# Patient Record
Sex: Male | Born: 1990
Health system: Southern US, Community
[De-identification: ages and names within clinical notes are randomized; demographics above are authoritative.]

## PROBLEM LIST (undated history)

## (undated) DIAGNOSIS — F259 Schizoaffective disorder, unspecified: Secondary | ICD-10-CM

## (undated) DIAGNOSIS — I10 Essential (primary) hypertension: Secondary | ICD-10-CM

## (undated) DIAGNOSIS — G4700 Insomnia, unspecified: Secondary | ICD-10-CM

## (undated) HISTORY — DX: Insomnia, unspecified: G47.00

## (undated) HISTORY — DX: Essential (primary) hypertension: I10

---
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
## 2009-11-26 ENCOUNTER — Emergency Department (HOSPITAL_COMMUNITY): Admission: EM | Admit: 2009-11-26 | Discharge: 2009-11-27 | Payer: Self-pay | Admitting: Emergency Medicine

## 2014-02-05 DIAGNOSIS — F259 Schizoaffective disorder, unspecified: Secondary | ICD-10-CM

## 2014-02-05 HISTORY — DX: Schizoaffective disorder, unspecified: F25.9

## 2014-11-11 ENCOUNTER — Other Ambulatory Visit (HOSPITAL_COMMUNITY): Payer: Self-pay | Admitting: Family Medicine

## 2014-11-11 DIAGNOSIS — F411 Generalized anxiety disorder: Secondary | ICD-10-CM

## 2014-11-11 DIAGNOSIS — R519 Headache, unspecified: Secondary | ICD-10-CM

## 2014-11-11 DIAGNOSIS — R51 Headache: Principal | ICD-10-CM

## 2014-11-11 DIAGNOSIS — F410 Panic disorder [episodic paroxysmal anxiety] without agoraphobia: Secondary | ICD-10-CM

## 2014-11-12 ENCOUNTER — Ambulatory Visit (HOSPITAL_COMMUNITY): Payer: BLUE CROSS/BLUE SHIELD

## 2014-11-19 ENCOUNTER — Ambulatory Visit (HOSPITAL_COMMUNITY)
Admission: RE | Admit: 2014-11-19 | Discharge: 2014-11-19 | Disposition: A | Discharge status: Home / Self Care | Payer: BLUE CROSS/BLUE SHIELD | Source: Ambulatory Visit | Attending: Family Medicine | Admitting: Family Medicine

## 2014-11-19 DIAGNOSIS — F411 Generalized anxiety disorder: Secondary | ICD-10-CM

## 2014-11-19 DIAGNOSIS — R51 Headache: Secondary | ICD-10-CM | POA: Diagnosis present

## 2014-11-19 DIAGNOSIS — R519 Headache, unspecified: Secondary | ICD-10-CM

## 2014-11-19 DIAGNOSIS — F419 Anxiety disorder, unspecified: Secondary | ICD-10-CM | POA: Diagnosis not present

## 2016-09-03 IMAGING — CT CT HEAD W/O CM
1 series · 16 of 30 positions shown, 20 images · non-contrast
Comparison: None.

CLINICAL DATA: Intermittent strange feeling inside of head. Patient
on anti-anxiety medication.

EXAM:
CT HEAD WITHOUT CONTRAST
TECHNIQUE: Contiguous axial images were obtained from the base of the skull
through the vertex without intravenous contrast.

[Series 2: head 5.0 h30s · axial · 0.50mm/px · z∈[-79,+66]mm · 16 of 33 slices shown, 20 images]
[im 2/33  brain]
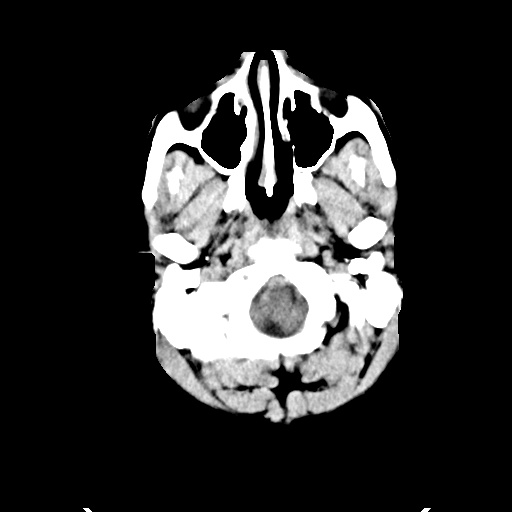
[im 2/33  bone]
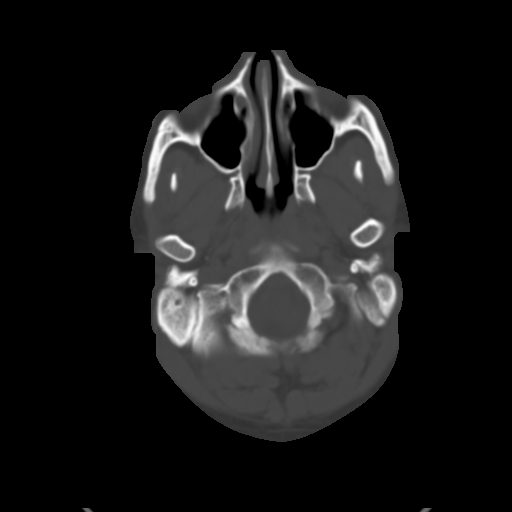
[im 4/33  brain]
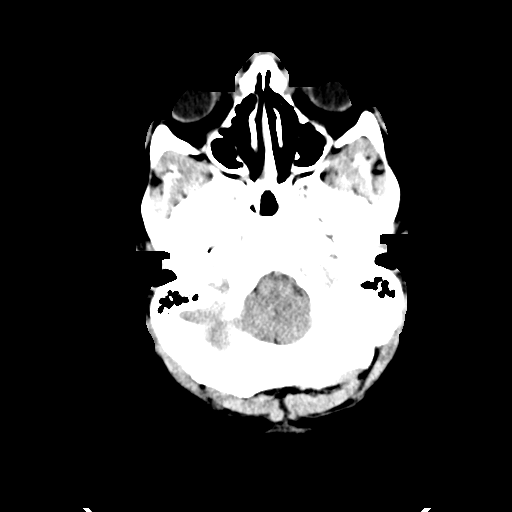
[im 6/33  brain]
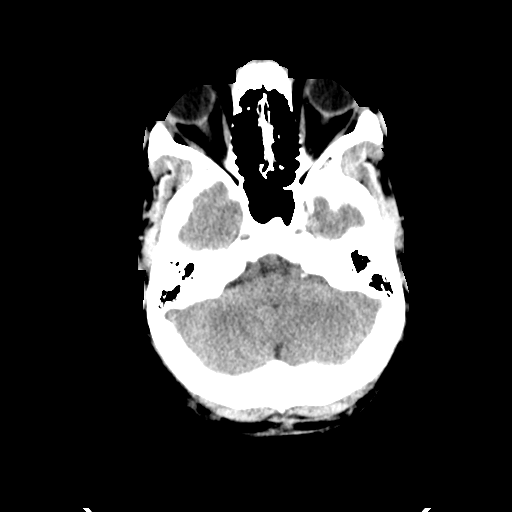
[im 8/33  brain]
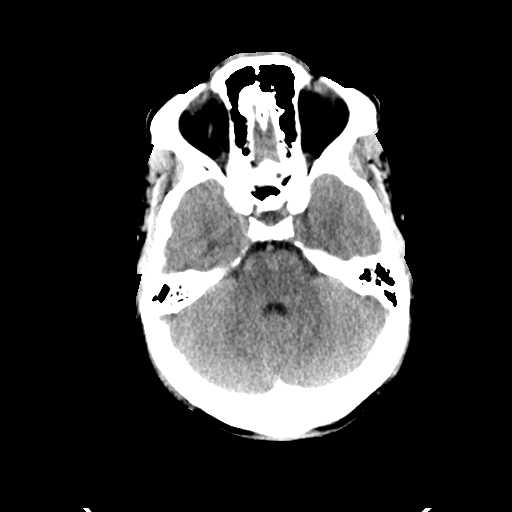
[im 9/33  brain]
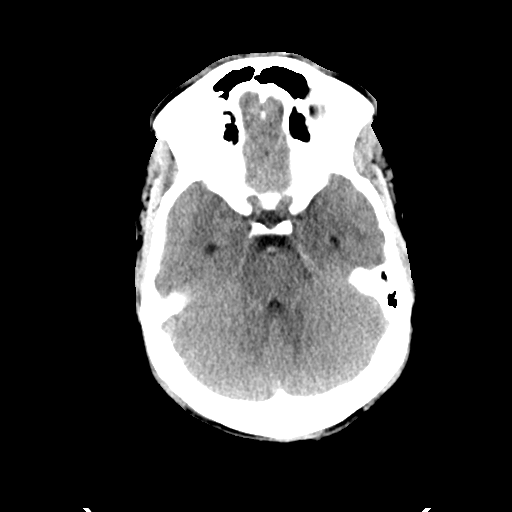
[im 9/33  bone]
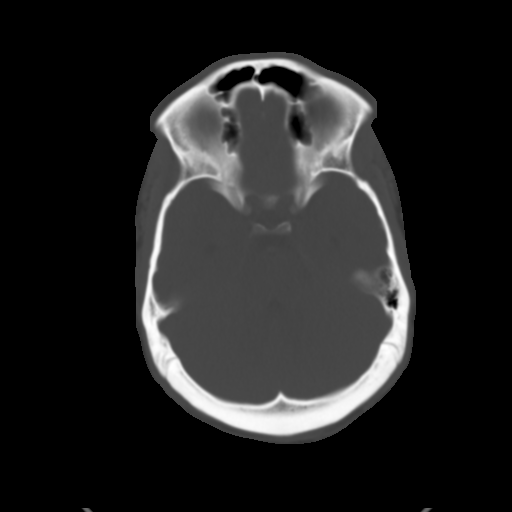
[im 12/33  brain]
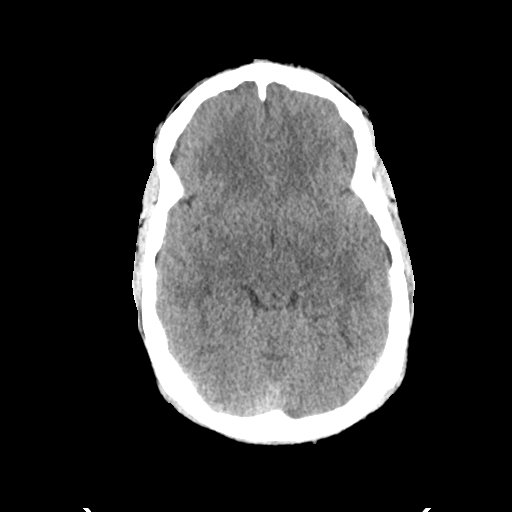
[im 14/33  brain]
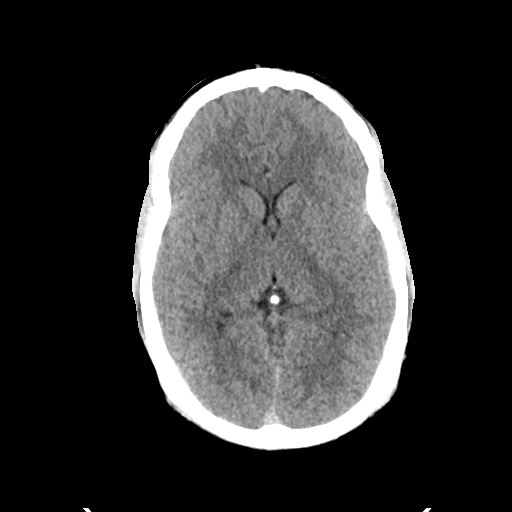
[im 16/33  brain]
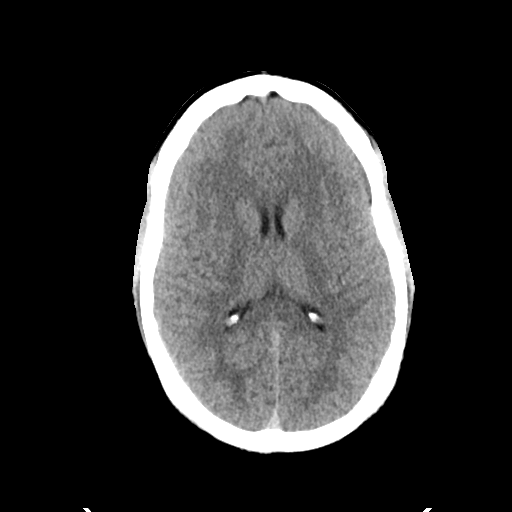
[im 17/33  brain]
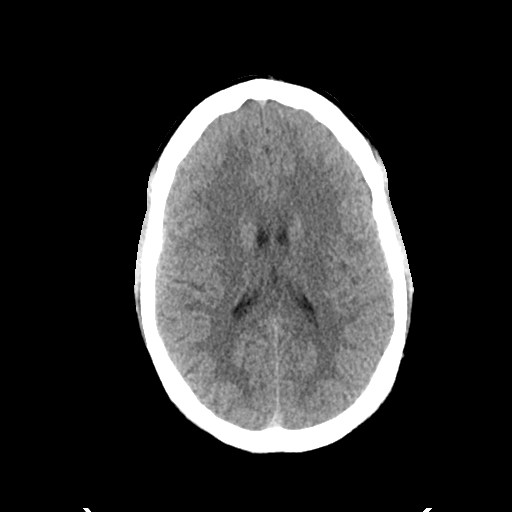
[im 17/33  bone]
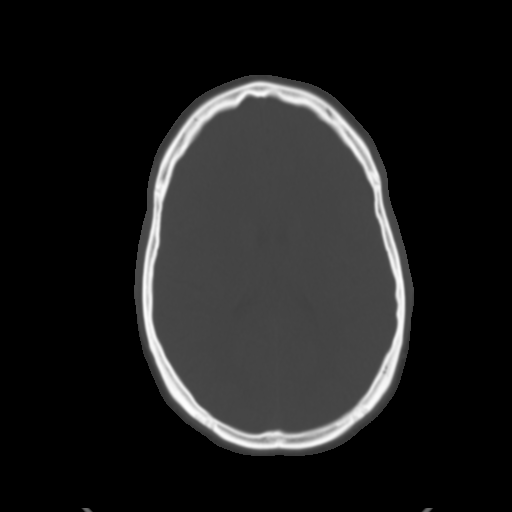
[im 19/33  brain]
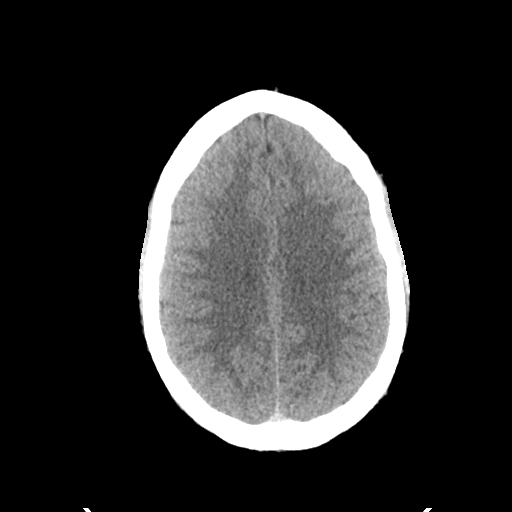
[im 21/33  brain]
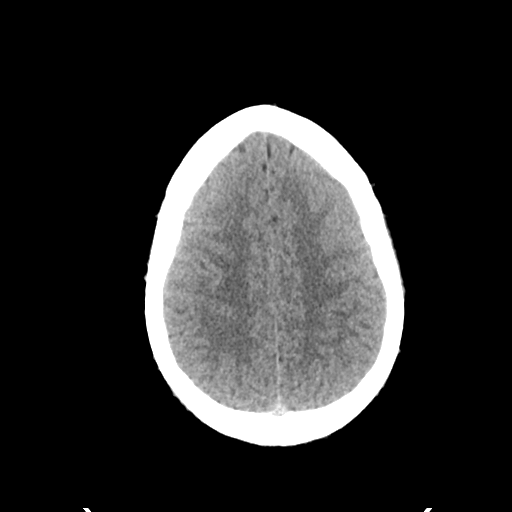
[im 24/33  brain]
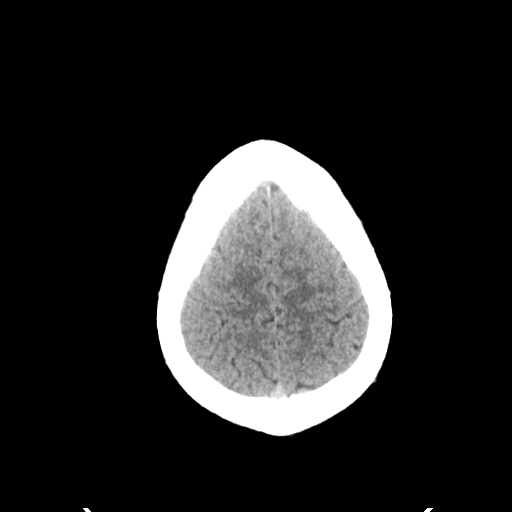
[im 25/33  brain]
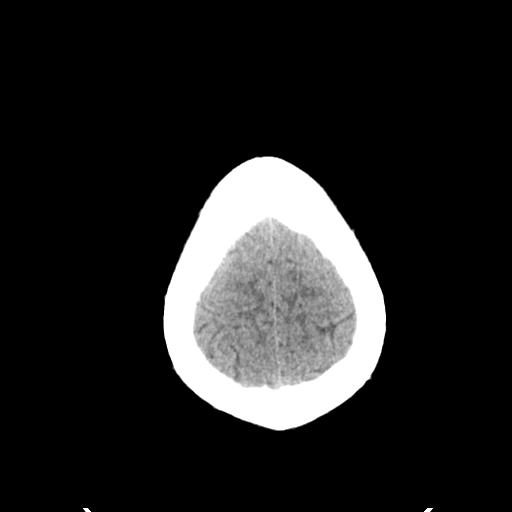
[im 25/33  bone]
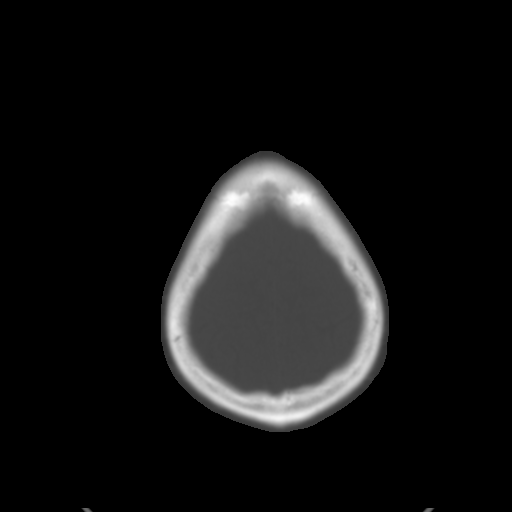
[im 27/33  brain]
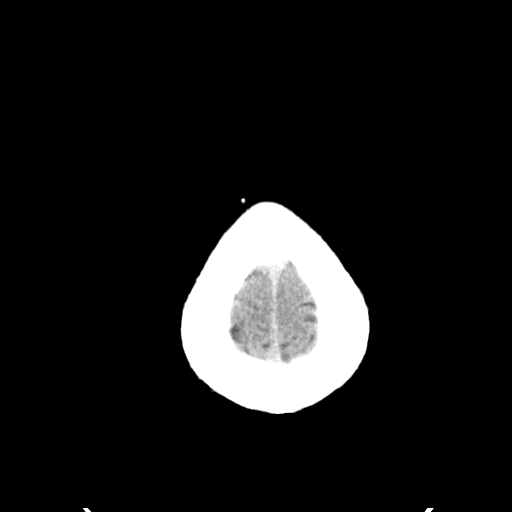
[im 29/33  brain]
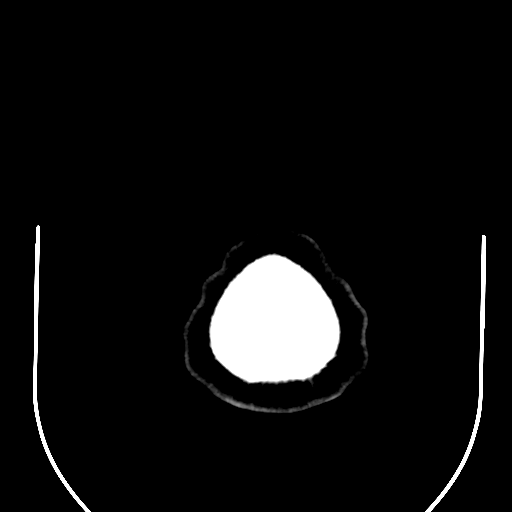
[im 31/33  brain]
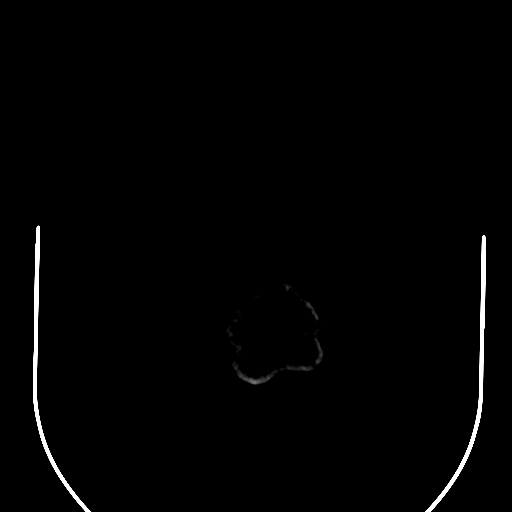

[16 of 30 positions shown; findings below may reference images not displayed]

FINDINGS: No evidence for acute infarction, hemorrhage, mass lesion,
hydrocephalus, or extra-axial fluid. No atrophy or white matter
disease. Intact calvarium. No acute sinus or mastoid disease.
IMPRESSION: Negative.

## 2018-02-05 DIAGNOSIS — G4700 Insomnia, unspecified: Secondary | ICD-10-CM | POA: Diagnosis not present

## 2018-02-05 DIAGNOSIS — Z23 Encounter for immunization: Secondary | ICD-10-CM | POA: Diagnosis not present

## 2018-02-05 DIAGNOSIS — I10 Essential (primary) hypertension: Secondary | ICD-10-CM | POA: Diagnosis not present

## 2018-02-05 DIAGNOSIS — Z0000 Encounter for general adult medical examination without abnormal findings: Secondary | ICD-10-CM | POA: Diagnosis not present

## 2018-06-04 DIAGNOSIS — G4700 Insomnia, unspecified: Secondary | ICD-10-CM | POA: Diagnosis not present

## 2018-06-04 DIAGNOSIS — I10 Essential (primary) hypertension: Secondary | ICD-10-CM | POA: Diagnosis not present

## 2018-10-08 DIAGNOSIS — G4700 Insomnia, unspecified: Secondary | ICD-10-CM | POA: Diagnosis not present

## 2018-10-08 DIAGNOSIS — I10 Essential (primary) hypertension: Secondary | ICD-10-CM | POA: Diagnosis not present

## 2019-01-30 NOTE — Progress Notes (Signed)
New Patient Office Visit  Assessment & Plan:  1. Essential hypertension - BP well controlled without medication. Encouraged patient to monitor and let me know if consistently > 140/90. Education provided on the DASH diet. Encouraged weight loss which he has been working on.   2. Insomnia, unspecified type - Well controlled on current regimen. Controlled substance agreement signed for Ambien.  - zolpidem (AMBIEN) 5 MG tablet; Take 1 tablet (5 mg total) by mouth at bedtime as needed for sleep.  Dispense: 30 tablet; Refill: 5 - Compliance Drug Analysis, Ur  3. Schizoaffective disorder, unspecified type (HCC) - Well controlled on current regimen. Discussed that I am agreeable to fill medication as long as it is working but if he starts to have trouble he would need a referral to a psychiatrist; he understands and is agreeable. - thiothixene (NAVANE) 5 MG capsule; Take 1 capsule (5 mg total) by mouth daily.  Dispense: 90 capsule; Refill: 1  4. Controlled substance agreement signed - Signed for Ambien.  - Compliance Drug Analysis, Ur  5. Encounter to establish care   Follow-up: Return in about 6 months (around 08/04/2019) for follow-up of chronic medication conditions.   Deliah BostonBritney Taryll Reichenberger, MSN, APRN, FNP-C [facility]  Subjective:  Patient ID: Kenneth Dominguez, male    DOB: 07/14/1990  Age: 28 y.o. MRN: [id number]  Patient Care Team: Gwenlyn FudgeJoyce, Casilda Pickerill F, FNP as PCP - General (Family Medicine)  CC:  Chief Complaint  Patient presents with   New Patient (Initial Visit)    HPI Kenneth FilaJeffrey T Dorminey presents to establish care. He is transferring care from Dr. Delman Goshorn CopaNyland's office as he has retired and the office has closed. Patient also is in need of medication refills.   Patient reports he was diagnosed with schizoaffective disorder 3-4 years ago by a psychiatrist whose name he doesn't recall at the moment. He was having panic attacks, hearing voices, and hallucinations.  He was placed on thiothixene 5 mg once daily and states he has been doing great since then.   Patient is taking Ambien due to trouble sleeping. He has been taking it for years. He failed therapy with melatonin and Ativan. He feels his difficulty sleeping is related to his schizoaffective disorder.   He reports he has not been taking his blood pressure medication for some time.   Review of Systems  Constitutional: Negative for chills, fever, malaise/fatigue and weight loss.  HENT: Negative for congestion, ear discharge, ear pain, nosebleeds, sinus pain, sore throat and tinnitus.   Eyes: Negative for blurred vision, double vision, pain, discharge and redness.  Respiratory: Negative for cough, shortness of breath and wheezing.   Cardiovascular: Negative for chest pain, palpitations and leg swelling.  Gastrointestinal: Negative for abdominal pain, constipation, diarrhea, heartburn, nausea and vomiting.  Genitourinary: Negative for dysuria, frequency and urgency.  Musculoskeletal: Negative for myalgias.  Skin: Negative for rash.  Neurological: Negative for dizziness, seizures, weakness and headaches.  Psychiatric/Behavioral: Negative for depression, substance abuse and suicidal ideas. The patient is not nervous/anxious.      Current Outpatient Medications:    thiothixene (NAVANE) 5 MG capsule, Take 1 capsule (5 mg total) by mouth daily., Disp: 90 capsule, Rfl: 1   [START ON 02/06/2019] zolpidem (AMBIEN) 5 MG tablet, Take 1 tablet (5 mg total) by mouth at bedtime as needed for sleep., Disp: 30 tablet, Rfl: 5  No Known Allergies  Past Medical History:  Diagnosis Date   Hypertension    Insomnia  Schizoaffective disorder ([place]) 02/05/2014    History reviewed. No pertinent surgical history.  Family History  Problem Relation Age of Onset   Hypertension Mother    Asthma Father    Hypertension Father    Heart disease Maternal Grandmother     Social History   Socioeconomic  History   Marital status: Single    Spouse name: Not on file   Number of children: Not on file   Years of education: Not on file   Highest education level: Not on file  Occupational History   Occupation: Psychologist, prison and probation services  Social Needs   Financial resource strain: Not on file   Food insecurity    Worry: Not on file    Inability: Not on file   Transportation needs    Medical: Not on file    Non-medical: Not on file  Tobacco Use   Smoking status: Current Some Day Smoker    Types: Cigarettes   Smokeless tobacco: Never Used  Substance and Sexual Activity   Alcohol use: Yes    Comment: occ   Drug use: Never   Sexual activity: Not on file  Lifestyle   Physical activity    Days per week: Not on file    Minutes per session: Not on file   Stress: Not on file  Relationships   Social connections    Talks on phone: Not on file    Gets together: Not on file    Attends religious service: Not on file    Active member of club or organization: Not on file    Attends meetings of clubs or organizations: Not on file    Relationship status: Not on file   Intimate partner violence    Fear of current or ex partner: Not on file    Emotionally abused: Not on file    Physically abused: Not on file    Forced sexual activity: Not on file  Other Topics Concern   Not on file  Social History Narrative   Not on file    Objective:   Today's Vitals: BP 127/86    Pulse 74    Temp (!) 97.5 F (36.4 C) (Temporal)    Ht 6\' 1"  (1.854 m)    Wt 228 lb 9.6 oz (103.7 kg)    BMI 30.16 kg/m   Physical Exam Vitals signs reviewed.  Constitutional:      General: He is not in acute distress.    Appearance: Normal appearance. He is obese. He is not ill-appearing, toxic-appearing or diaphoretic.  HENT:     Head: Normocephalic and atraumatic.     Right Ear: Tympanic membrane, ear canal and external ear normal. There is no impacted cerumen.     Left Ear: Tympanic membrane, ear canal and  external ear normal. There is no impacted cerumen.     Nose: Nose normal. No congestion or rhinorrhea.     Mouth/Throat:     Mouth: Mucous membranes are moist.     Pharynx: Oropharynx is clear. No oropharyngeal exudate or posterior oropharyngeal erythema.  Eyes:     General: No scleral icterus.       Right eye: No discharge.        Left eye: No discharge.     Conjunctiva/sclera: Conjunctivae normal.     Pupils: Pupils are equal, round, and reactive to light.  Neck:     Musculoskeletal: Normal range of motion and neck supple. No neck rigidity or muscular tenderness.  Cardiovascular:  Rate and Rhythm: Normal rate and regular rhythm.     Heart sounds: Normal heart sounds. No murmur. No friction rub. No gallop.   Pulmonary:     Effort: Pulmonary effort is normal. No respiratory distress.     Breath sounds: Normal breath sounds. No stridor. No wheezing, rhonchi or rales.  Abdominal:     General: Abdomen is flat. Bowel sounds are normal. There is no distension.     Palpations: Abdomen is soft. There is no mass.     Tenderness: There is no abdominal tenderness. There is no guarding or rebound.     Hernia: No hernia is present.  Musculoskeletal: Normal range of motion.     Right lower leg: No edema.     Left lower leg: No edema.  Lymphadenopathy:     Cervical: No cervical adenopathy.  Skin:    General: Skin is warm and dry.     Capillary Refill: Capillary refill takes less than 2 seconds.  Neurological:     General: No focal deficit present.     Mental Status: He is alert and oriented to person, place, and time. Mental status is at baseline.  Psychiatric:        Mood and Affect: Mood normal.        Behavior: Behavior normal.        Thought Content: Thought content normal.        Judgment: Judgment normal.

## 2019-01-31 ENCOUNTER — Other Ambulatory Visit: Payer: Self-pay

## 2019-02-01 ENCOUNTER — Ambulatory Visit (INDEPENDENT_AMBULATORY_CARE_PROVIDER_SITE_OTHER): Payer: BC Managed Care – PPO | Admitting: Family Medicine

## 2019-02-01 ENCOUNTER — Encounter: Payer: Self-pay | Admitting: Family Medicine

## 2019-02-01 VITALS — BP 127/86 | HR 74 | Temp 97.5°F | Ht 73.0 in | Wt 228.6 lb

## 2019-02-01 DIAGNOSIS — Z7689 Persons encountering health services in other specified circumstances: Secondary | ICD-10-CM

## 2019-02-01 DIAGNOSIS — G4700 Insomnia, unspecified: Secondary | ICD-10-CM | POA: Insufficient documentation

## 2019-02-01 DIAGNOSIS — F259 Schizoaffective disorder, unspecified: Secondary | ICD-10-CM

## 2019-02-01 DIAGNOSIS — I10 Essential (primary) hypertension: Secondary | ICD-10-CM

## 2019-02-01 DIAGNOSIS — Z79899 Other long term (current) drug therapy: Secondary | ICD-10-CM | POA: Diagnosis not present

## 2019-02-01 MED ORDER — THIOTHIXENE 5 MG PO CAPS: 5.0000 mg | ORAL_CAPSULE | Freq: Every day | ORAL | 1 refills | Status: DC

## 2019-02-01 MED ORDER — ZOLPIDEM TARTRATE 5 MG PO TABS: 5.0000 mg | ORAL_TABLET | Freq: Every evening | ORAL | 5 refills | Status: DC | PRN

## 2019-02-01 NOTE — Patient Instructions (Signed)

## 2019-02-05 LAB — COMPLIANCE DRUG ANALYSIS, UR

## 2019-07-08 ENCOUNTER — Other Ambulatory Visit: Payer: Self-pay | Admitting: Family Medicine

## 2019-07-08 DIAGNOSIS — F259 Schizoaffective disorder, unspecified: Secondary | ICD-10-CM

## 2019-07-09 NOTE — Telephone Encounter (Signed)
Please ask patient to schedule 6 month follow-up from last appointment for routine follow-up.

## 2019-08-02 ENCOUNTER — Other Ambulatory Visit: Payer: Self-pay | Admitting: Family Medicine

## 2019-08-02 DIAGNOSIS — G4700 Insomnia, unspecified: Secondary | ICD-10-CM

## 2019-08-06 ENCOUNTER — Encounter: Payer: Self-pay | Admitting: Family Medicine

## 2019-08-06 ENCOUNTER — Telehealth: Payer: Self-pay | Admitting: Family Medicine

## 2019-08-06 ENCOUNTER — Ambulatory Visit (INDEPENDENT_AMBULATORY_CARE_PROVIDER_SITE_OTHER): Payer: BC Managed Care – PPO | Admitting: Family Medicine

## 2019-08-06 DIAGNOSIS — G4700 Insomnia, unspecified: Secondary | ICD-10-CM

## 2019-08-06 DIAGNOSIS — F259 Schizoaffective disorder, unspecified: Secondary | ICD-10-CM

## 2019-08-06 MED ORDER — THIOTHIXENE 5 MG PO CAPS: 5.0000 mg | ORAL_CAPSULE | Freq: Every day | ORAL | 1 refills | Status: DC

## 2019-08-06 MED ORDER — ZOLPIDEM TARTRATE 5 MG PO TABS: 5.0000 mg | ORAL_TABLET | Freq: Every evening | ORAL | 5 refills | Status: DC | PRN

## 2019-08-06 NOTE — Progress Notes (Signed)
   Virtual Visit via Telephone Note  I connected with Kenneth Dominguez on 08/06/19 at 10:57 AM by telephone and verified that I am speaking with the correct person using two identifiers. Kenneth Dominguez is currently located at home and nobody is currently with him during this visit. The provider, Gwenlyn Fudge, FNP is located in their office at time of visit.  I discussed the limitations, risks, security and privacy concerns of performing an evaluation and management service by telephone and the availability of in person appointments. I also discussed with the patient that there may be a patient responsible charge related to this service. The patient expressed understanding and agreed to proceed.  Subjective: PCP: Gwenlyn Fudge, FNP  Chief Complaint  Patient presents with  . Medical Management of Chronic Issues   Patient is in need of medication refills.  He sleeps well with Ambien 5 mg at bedtime.  Schizophrenia well controlled with the use exam 5 mg once daily.   ROS: Per HPI  Current Outpatient Medications:  .  thiothixene (NAVANE) 5 MG capsule, Take 1 capsule (5 mg total) by mouth daily., Disp: 90 capsule, Rfl: 1 .  zolpidem (AMBIEN) 5 MG tablet, Take 1 tablet (5 mg total) by mouth at bedtime as needed for sleep., Disp: 30 tablet, Rfl: 5  No Known Allergies Past Medical History:  Diagnosis Date  . Hypertension   . Insomnia   . Schizoaffective disorder (HCC) 02/05/2014    Observations/Objective: A&O  No respiratory distress or wheezing audible over the phone Mood, judgement, and thought processes all WNL  Assessment and Plan: 1. Insomnia, unspecified type - Well controlled on current regimen.  - zolpidem (AMBIEN) 5 MG tablet; Take 1 tablet (5 mg total) by mouth at bedtime as needed for sleep.  Dispense: 30 tablet; Refill: 5  2. Schizoaffective disorder, unspecified type (HCC) - Well controlled on current regimen.  - thiothixene (NAVANE) 5 MG capsule; Take  1 capsule (5 mg total) by mouth daily.  Dispense: 90 capsule; Refill: 1   Follow Up Instructions: Return in about 6 months (around 02/03/2020) for annual physical.  I discussed the assessment and treatment plan with the patient. The patient was provided an opportunity to ask questions and all were answered. The patient agreed with the plan and demonstrated an understanding of the instructions.   The patient was advised to call back or seek an in-person evaluation if the symptoms worsen or if the condition fails to improve as anticipated.  The above assessment and management plan was discussed with the patient. The patient verbalized understanding of and has agreed to the management plan. Patient is aware to call the clinic if symptoms persist or worsen. Patient is aware when to return to the clinic for a follow-up visit. Patient educated on when it is appropriate to go to the emergency department.   Time call ended: 11:05 AM  I provided 11 minutes of non-face-to-face time during this encounter.  Deliah Boston, MSN, APRN, FNP-C [facility] 08/06/19

## 2019-09-26 DIAGNOSIS — Z23 Encounter for immunization: Secondary | ICD-10-CM | POA: Diagnosis not present

## 2019-10-18 DIAGNOSIS — Z23 Encounter for immunization: Secondary | ICD-10-CM | POA: Diagnosis not present

## 2019-10-29 ENCOUNTER — Encounter: Payer: Self-pay | Admitting: *Deleted

## 2020-01-10 ENCOUNTER — Other Ambulatory Visit: Payer: Self-pay | Admitting: Family Medicine

## 2020-01-10 DIAGNOSIS — F259 Schizoaffective disorder, unspecified: Secondary | ICD-10-CM

## 2020-02-04 ENCOUNTER — Encounter: Payer: Self-pay | Admitting: Family Medicine

## 2020-02-05 ENCOUNTER — Encounter: Payer: Self-pay | Admitting: Family Medicine

## 2020-02-05 ENCOUNTER — Ambulatory Visit (INDEPENDENT_AMBULATORY_CARE_PROVIDER_SITE_OTHER): Payer: BC Managed Care – PPO | Admitting: Family Medicine

## 2020-02-05 ENCOUNTER — Other Ambulatory Visit: Payer: Self-pay

## 2020-02-05 VITALS — BP 138/91 | HR 74 | Temp 97.2°F | Ht 73.0 in | Wt 236.0 lb

## 2020-02-05 DIAGNOSIS — Z0000 Encounter for general adult medical examination without abnormal findings: Secondary | ICD-10-CM

## 2020-02-05 DIAGNOSIS — F259 Schizoaffective disorder, unspecified: Secondary | ICD-10-CM

## 2020-02-05 DIAGNOSIS — Z0001 Encounter for general adult medical examination with abnormal findings: Secondary | ICD-10-CM

## 2020-02-05 DIAGNOSIS — Z79899 Other long term (current) drug therapy: Secondary | ICD-10-CM | POA: Diagnosis not present

## 2020-02-05 DIAGNOSIS — G4700 Insomnia, unspecified: Secondary | ICD-10-CM

## 2020-02-05 DIAGNOSIS — I10 Essential (primary) hypertension: Secondary | ICD-10-CM | POA: Diagnosis not present

## 2020-02-05 DIAGNOSIS — R41840 Attention and concentration deficit: Secondary | ICD-10-CM | POA: Insufficient documentation

## 2020-02-05 MED ORDER — THIOTHIXENE 5 MG PO CAPS: ORAL_CAPSULE | ORAL | 3 refills | Status: DC

## 2020-02-05 MED ORDER — ATOMOXETINE HCL 40 MG PO CAPS: 40.0000 mg | ORAL_CAPSULE | Freq: Every day | ORAL | 2 refills | Status: DC

## 2020-02-05 MED ORDER — ZOLPIDEM TARTRATE 5 MG PO TABS: 5.0000 mg | ORAL_TABLET | Freq: Every evening | ORAL | 5 refills | Status: DC | PRN

## 2020-02-05 NOTE — Patient Instructions (Signed)
Preventive Care 21-30 Years Old, Male Preventive care refers to lifestyle choices and visits with your health care provider that can promote health and wellness. This includes:  A yearly physical exam. This is also called an annual well check.  Regular dental and eye exams.  Immunizations.  Screening for certain conditions.  Healthy lifestyle choices, such as eating a healthy diet, getting regular exercise, not using drugs or products that contain nicotine and tobacco, and limiting alcohol use. What can I expect for my preventive care visit? Physical exam Your health care provider will check:  Height and weight. These may be used to calculate body mass index (BMI), which is a measurement that tells if you are at a healthy weight.  Heart rate and blood pressure.  Your skin for abnormal spots. Counseling Your health care provider may ask you questions about:  Alcohol, tobacco, and drug use.  Emotional well-being.  Home and relationship well-being.  Sexual activity.  Eating habits.  Work and work environment. What immunizations do I need?  Influenza (flu) vaccine  This is recommended every year. Tetanus, diphtheria, and pertussis (Tdap) vaccine  You may need a Td booster every 10 years. Varicella (chickenpox) vaccine  You may need this vaccine if you have not already been vaccinated. Human papillomavirus (HPV) vaccine  If recommended by your health care provider, you may need three doses over 6 months. Measles, mumps, and rubella (MMR) vaccine  You may need at least one dose of MMR. You may also need a second dose. Meningococcal conjugate (MenACWY) vaccine  One dose is recommended if you are 19-21 years old and a first-year college student living in a residence hall, or if you have one of several medical conditions. You may also need additional booster doses. Pneumococcal conjugate (PCV13) vaccine  You may need this if you have certain conditions and were not  previously vaccinated. Pneumococcal polysaccharide (PPSV23) vaccine  You may need one or two doses if you smoke cigarettes or if you have certain conditions. Hepatitis A vaccine  You may need this if you have certain conditions or if you travel or work in places where you may be exposed to hepatitis A. Hepatitis B vaccine  You may need this if you have certain conditions or if you travel or work in places where you may be exposed to hepatitis B. Haemophilus influenzae type b (Hib) vaccine  You may need this if you have certain risk factors. You may receive vaccines as individual doses or as more than one vaccine together in one shot (combination vaccines). Talk with your health care provider about the risks and benefits of combination vaccines. What tests do I need? Blood tests  Lipid and cholesterol levels. These may be checked every 5 years starting at age 20.  Hepatitis C test.  Hepatitis B test. Screening   Diabetes screening. This is done by checking your blood sugar (glucose) after you have not eaten for a while (fasting).  Sexually transmitted disease (STD) testing. Talk with your health care provider about your test results, treatment options, and if necessary, the need for more tests. Follow these instructions at home: Eating and drinking   Eat a diet that includes fresh fruits and vegetables, whole grains, lean protein, and low-fat dairy products.  Take vitamin and mineral supplements as recommended by your health care provider.  Do not drink alcohol if your health care provider tells you not to drink.  If you drink alcohol: ? Limit how much you have to 0-2   0-2 drinks a day. ? Be aware of how much alcohol is in your drink. In the [place] one drink equals one 12 oz bottle of beer (355 mL), one 5 oz glass of wine (148 mL), or one 1 oz glass of hard liquor (44 mL). Lifestyle  Take daily care of your teeth and gums.  Stay active. Exercise for at least 30 minutes on 5 or  more days each week.  Do not use any products that contain nicotine or tobacco, such as cigarettes, e-cigarettes, and chewing tobacco. If you need help quitting, ask your health care provider.  If you are sexually active, practice safe sex. Use a condom or other form of protection to prevent STIs (sexually transmitted infections). What's next?  Go to your health care provider once a year for a well check visit.  Ask your health care provider how often you should have your eyes and teeth checked.  Stay up to date on all vaccines. This information is not intended to replace advice given to you by your health care provider. Make sure you discuss any questions you have with your health care provider. Document Revised: 05/31/2018 Document Reviewed: 05/31/2018 Elsevier Patient Education  2020 [employer].

## 2020-02-05 NOTE — Progress Notes (Signed)
Assessment & Plan:  1. Well adult exam - Preventive health education provided.  Patient has had COVID-19 vaccines and will call us with these dates.  He is interested in getting the influenza vaccine but it is not yet available.  He declines HIV and hepatitis C screening.  He is up-to-date with tetanus. - CBC with Differential/Platelet - CMP14+EGFR - Lipid panel (not fasting)  2. Insomnia, unspecified type - Well controlled on current regimen.  - zolpidem (AMBIEN) 5 MG tablet; Take 1 tablet (5 mg total) by mouth at bedtime as needed for sleep.  Dispense: 30 tablet; Refill: 5 - Compliance Drug Analysis, Ur  3. Controlled substance agreement signed - Signed for Ambien.  Patient did disclose that he has recently been using Adderall to help with trouble focusing, which he does not have a prescription for.  PDMP reviewed with no concerning findings.  Urine drug screen obtained today. - Compliance Drug Analysis, Ur  4. Schizoaffective disorder, unspecified type ([place]) - Well controlled on current regimen.  - thiothixene (NAVANE) 5 MG capsule; TAKE (1) CAPSULE DAILY  Dispense: 90 capsule; Refill: 3  5. Essential hypertension - Controlled without medication.  Continue heart healthy diet and incorporate exercise. - CBC with Differential/Platelet - CMP14+EGFR - Lipid panel  6. Difficulty concentrating - Trial of Strattera started today.  Discussed with patient that if this does not work and he wants to pursue a stimulant he will be referred to psychiatry. - atomoxetine (STRATTERA) 40 MG capsule; Take 1 capsule (40 mg total) by mouth daily.  Dispense: 30 capsule; Refill: 2   Follow-up: Return in about 2 months (around 04/06/2020) for Difficulty concentrating/Strattera.   Kenneth Limes, MSN, APRN, FNP-C [facility]  Subjective:  Patient ID: Kenneth Dominguez, male    DOB: [birth year]  Age: 29 y.o. MRN: [id number]  Patient Care Team: Loman Brooklyn, FNP as PCP -  General (Family Medicine)   CC:  Chief Complaint  Patient presents with  . Annual Exam    Patient states that he has been having trouble focussing and would like to talk to the doctor about some personal stuff.    HPI [name] presents for his annual physical.  Occupation: Engineer, technical sales, Marital status: engaged, Substance use: none Diet: keto from time to time, Exercise: none Last eye exam: within the past year Last dental exam: 2 years ago Immunizations: Flu Vaccine: not yet availabe, but will get when it is Tdap Vaccine: up to date  COVID-19 Vaccine: up to date - patient will call back with dates  DEPRESSION SCREENING PHQ 2/9 Scores 02/05/2020 02/01/2019  PHQ - 2 Score 0 0     Patient reports he has been taking Adderall that he got from a friend to help with his difficulty focusing.  States this has been a problem since he was a child but has never been on medication for it.  He is asking today if there is something else that he can take to help with focusing that is not a controlled substance as he does not want to be responsible for a controlled substance.  Last dose was yesterday.  Review of Systems  Constitutional: Negative for chills, fever, malaise/fatigue and weight loss.  HENT: Negative for congestion, ear discharge, ear pain, nosebleeds, sinus pain, sore throat and tinnitus.   Eyes: Negative for blurred vision, double vision, pain, discharge and redness.  Respiratory: Negative for cough, shortness of breath and wheezing.   Cardiovascular: Negative for chest pain,  palpitations and leg swelling.  Gastrointestinal: Negative for abdominal pain, constipation, diarrhea, heartburn, nausea and vomiting.  Genitourinary: Negative for dysuria, frequency and urgency.  Musculoskeletal: Negative for myalgias.  Skin: Negative for rash.  Neurological: Negative for dizziness, seizures, weakness and headaches.  Psychiatric/Behavioral: Negative for depression, substance  abuse and suicidal ideas. The patient is not nervous/anxious.     Current Outpatient Medications:  .  thiothixene (NAVANE) 5 MG capsule, TAKE (1) CAPSULE DAILY, Disp: 90 capsule, Rfl: 0 .  zolpidem (AMBIEN) 5 MG tablet, Take 1 tablet (5 mg total) by mouth at bedtime as needed for sleep., Disp: 30 tablet, Rfl: 5  No Known Allergies  Past Medical History:  Diagnosis Date  . Hypertension   . Insomnia   . Schizoaffective disorder ([place]) 02/05/2014    History reviewed. No pertinent surgical history.  Family History  Problem Relation Age of Onset  . Hypertension Mother   . Asthma Father   . Hypertension Father   . Heart disease Maternal Grandmother     Social History   Socioeconomic History  . Marital status: Single    Spouse name: Not on file  . Number of children: Not on file  . Years of education: Not on file  . Highest education level: Not on file  Occupational History  . Occupation: Psychologist, prison and probation services  Tobacco Use  . Smoking status: Current Some Day Smoker    Types: Cigarettes  . Smokeless tobacco: Never Used  Vaping Use  . Vaping Use: Every day  . Substances: Nicotine  Substance and Sexual Activity  . Alcohol use: Yes    Comment: occ  . Drug use: Never  . Sexual activity: Not on file  Other Topics Concern  . Not on file  Social History Narrative  . Not on file   Social Determinants of Health   Financial Resource Strain:   . Difficulty of Paying Living Expenses:   Food Insecurity:   . Worried About Charity fundraiser in the Last Year:   . Arboriculturist in the Last Year:   Transportation Needs:   . Film/video editor (Medical):   Marland Kitchen Lack of Transportation (Non-Medical):   Physical Activity:   . Days of Exercise per Week:   . Minutes of Exercise per Session:   Stress:   . Feeling of Stress :   Social Connections:   . Frequency of Communication with Friends and Family:   . Frequency of Social Gatherings with Friends and Family:   . Attends  Religious Services:   . Active Member of Clubs or Organizations:   . Attends Archivist Meetings:   Marland Kitchen Marital Status:   Intimate Partner Violence:   . Fear of Current or Ex-Partner:   . Emotionally Abused:   Marland Kitchen Physically Abused:   . Sexually Abused:       Objective:    BP (!) 138/91   Pulse 74   Temp (!) 97.2 F (36.2 C) (Temporal)   Ht _0  (1.854 m)   Wt 236 lb (107 kg)   SpO2 99%   BMI 31.14 kg/m   Wt Readings from Last 3 Encounters:  02/05/20 236 lb (107 kg)  02/01/19 228 lb 9.6 oz (103.7 kg)    Physical Exam Vitals reviewed.  Constitutional:      General: He is not in acute distress.    Appearance: Normal appearance. He is obese. He is not ill-appearing, toxic-appearing or diaphoretic.  HENT:  Head: Normocephalic and atraumatic.     Right Ear: Tympanic membrane, ear canal and external ear normal. There is no impacted cerumen.     Left Ear: Tympanic membrane, ear canal and external ear normal. There is no impacted cerumen.     Nose: Nose normal. No congestion or rhinorrhea.     Mouth/Throat:     Mouth: Mucous membranes are moist.     Pharynx: Oropharynx is clear. No oropharyngeal exudate or posterior oropharyngeal erythema.  Eyes:     General: No scleral icterus.       Right eye: No discharge.        Left eye: No discharge.     Conjunctiva/sclera: Conjunctivae normal.     Pupils: Pupils are equal, round, and reactive to light.  Cardiovascular:     Rate and Rhythm: Normal rate and regular rhythm.     Heart sounds: Normal heart sounds. No murmur heard.  No friction rub. No gallop.   Pulmonary:     Effort: Pulmonary effort is normal. No respiratory distress.     Breath sounds: Normal breath sounds. No stridor. No wheezing, rhonchi or rales.  Abdominal:     General: Abdomen is flat. Bowel sounds are normal. There is no distension.     Palpations: Abdomen is soft. There is no mass.     Tenderness: There is no abdominal tenderness. There is no  guarding or rebound.     Hernia: No hernia is present.  Musculoskeletal:        General: Normal range of motion.     Cervical back: Normal range of motion and neck supple. No rigidity. No muscular tenderness.     Right lower leg: No edema.     Left lower leg: No edema.  Lymphadenopathy:     Cervical: No cervical adenopathy.  Skin:    General: Skin is warm and dry.     Capillary Refill: Capillary refill takes less than 2 seconds.  Neurological:     General: No focal deficit present.     Mental Status: He is alert and oriented to person, place, and time. Mental status is at baseline.  Psychiatric:        Mood and Affect: Mood normal.        Behavior: Behavior normal.        Thought Content: Thought content normal.        Judgment: Judgment normal.     No results found for: TSH No results found for: WBC, HGB, HCT, MCV, PLT No results found for: NA, K, CHLORIDE, CO2, GLUCOSE, BUN, CREATININE, BILITOT, ALKPHOS, AST, ALT, PROT, ALBUMIN, CALCIUM, ANIONGAP, EGFR, GFR No results found for: CHOL No results found for: HDL No results found for: LDLCALC No results found for: TRIG No results found for: CHOLHDL No results found for: HGBA1C

## 2020-02-06 LAB — CMP14+EGFR
ALT: 35 IU/L (ref 0–44)
AST: 40 IU/L (ref 0–40)
Albumin/Globulin Ratio: 1.9 (ref 1.2–2.2)
Albumin: 4.4 g/dL (ref 4.1–5.2)
Alkaline Phosphatase: 64 IU/L (ref 48–121)
BUN/Creatinine Ratio: 9 (ref 9–20)
BUN: 11 mg/dL (ref 6–20)
Bilirubin Total: 0.4 mg/dL (ref 0.0–1.2)
CO2: 28 mmol/L (ref 20–29)
Calcium: 9.5 mg/dL (ref 8.7–10.2)
Chloride: 103 mmol/L (ref 96–106)
Creatinine, Ser: 1.16 mg/dL (ref 0.76–1.27)
GFR calc Af Amer: 97 mL/min/{1.73_m2} (ref >59)
GFR calc non Af Amer: 84 mL/min/{1.73_m2} (ref >59)
Globulin, Total: 2.3 g/dL (ref 1.5–4.5)
Glucose: 87 mg/dL (ref 65–99)
Potassium: 3.6 mmol/L (ref 3.5–5.2)
Sodium: 141 mmol/L (ref 134–144)
Total Protein: 6.7 g/dL (ref 6.0–8.5)

## 2020-02-06 LAB — CBC WITH DIFFERENTIAL/PLATELET
Basophils Absolute: 0.1 10*3/uL (ref 0.0–0.2)
Basos: 1 %
EOS (ABSOLUTE): 0.2 10*3/uL (ref 0.0–0.4)
Eos: 3 %
Hematocrit: 47.4 % (ref 37.5–51.0)
Hemoglobin: 16 g/dL (ref 13.0–17.7)
Immature Grans (Abs): 0 10*3/uL (ref 0.0–0.1)
Immature Granulocytes: 0 %
Lymphocytes Absolute: 2.3 10*3/uL (ref 0.7–3.1)
Lymphs: 36 %
MCH: 30.9 pg (ref 26.6–33.0)
MCHC: 33.8 g/dL (ref 31.5–35.7)
MCV: 92 fL (ref 79–97)
Monocytes Absolute: 0.5 10*3/uL (ref 0.1–0.9)
Monocytes: 7 %
Neutrophils Absolute: 3.2 10*3/uL (ref 1.4–7.0)
Neutrophils: 53 %
Platelets: 246 10*3/uL (ref 150–450)
RBC: 5.17 x10E6/uL (ref 4.14–5.80)
RDW: 13 % (ref 11.6–15.4)
WBC: 6.2 10*3/uL (ref 3.4–10.8)

## 2020-02-06 LAB — LIPID PANEL
Chol/HDL Ratio: 4.6 ratio (ref 0.0–5.0)
Cholesterol, Total: 152 mg/dL (ref 100–199)
HDL: 33 mg/dL — ABNORMAL LOW (ref >39)
LDL Chol Calc (NIH): 96 mg/dL (ref 0–99)
Triglycerides: 127 mg/dL (ref 0–149)
VLDL Cholesterol Cal: 23 mg/dL (ref 5–40)

## 2020-02-07 ENCOUNTER — Encounter: Payer: Self-pay | Admitting: Family Medicine

## 2020-02-08 LAB — COMPLIANCE DRUG ANALYSIS, UR

## 2020-02-10 ENCOUNTER — Other Ambulatory Visit: Payer: Self-pay | Admitting: Family Medicine

## 2020-02-19 ENCOUNTER — Encounter: Payer: Self-pay | Admitting: *Deleted

## 2020-03-11 ENCOUNTER — Telehealth: Payer: Self-pay | Admitting: *Deleted

## 2020-03-11 DIAGNOSIS — G4700 Insomnia, unspecified: Secondary | ICD-10-CM

## 2020-03-11 NOTE — Telephone Encounter (Signed)
RTC regarding letter sent to him about controlled substance agreement being broken for his Ambien.

## 2020-03-17 MED ORDER — TRAZODONE HCL 50 MG PO TABS: ORAL_TABLET | ORAL | 2 refills | Status: DC

## 2020-03-17 NOTE — Telephone Encounter (Signed)
Pt understood letter. He would like to try the Trazodone that was suggested in the letter, he uses [facility]

## 2020-03-17 NOTE — Addendum Note (Signed)
Addended by: Gwenlyn Fudge on: 03/17/2020 09:29 PM   Modules accepted: Orders

## 2020-03-17 NOTE — Telephone Encounter (Signed)
Trazodone sent to pharmacy

## 2020-04-07 ENCOUNTER — Ambulatory Visit: Payer: BC Managed Care – PPO | Admitting: Family Medicine

## 2020-04-15 ENCOUNTER — Ambulatory Visit: Payer: BC Managed Care – PPO | Admitting: Nurse Practitioner

## 2020-04-30 ENCOUNTER — Ambulatory Visit (INDEPENDENT_AMBULATORY_CARE_PROVIDER_SITE_OTHER): Payer: BC Managed Care – PPO | Admitting: Nurse Practitioner

## 2020-04-30 ENCOUNTER — Other Ambulatory Visit: Payer: Self-pay

## 2020-04-30 ENCOUNTER — Encounter: Payer: Self-pay | Admitting: Nurse Practitioner

## 2020-04-30 VITALS — BP 141/83 | HR 85 | Temp 98.5°F | Ht 73.0 in | Wt 233.0 lb

## 2020-04-30 DIAGNOSIS — G4700 Insomnia, unspecified: Secondary | ICD-10-CM | POA: Diagnosis not present

## 2020-04-30 DIAGNOSIS — R41840 Attention and concentration deficit: Secondary | ICD-10-CM

## 2020-04-30 MED ORDER — TRAZODONE HCL 50 MG PO TABS: ORAL_TABLET | ORAL | 2 refills | Status: DC

## 2020-04-30 MED ORDER — ATOMOXETINE HCL 40 MG PO CAPS: 40.0000 mg | ORAL_CAPSULE | Freq: Every day | ORAL | 2 refills | Status: DC

## 2020-04-30 NOTE — Assessment & Plan Note (Signed)
Medication effective for insomnia no changes necessary. Refill sent to pharmacy.

## 2020-04-30 NOTE — Progress Notes (Signed)
Established Patient Office Visit  Subjective:  Patient ID: Kenneth Dominguez, male    DOB: 08/11/1990  Age: 29 y.o. MRN: [id number]  CC:  Chief Complaint  Patient presents with  . Follow-up    6 week medication follow up     HPI Kenneth Dominguez is a 29 year old male who presents for follow-up difficulty concentrating.  This is not new for patient.  Patient was started on Strattera 40 mg daily.  Patient is reporting therapeutic effect from medication.  Patient is compliant with medication administration.  Patient is not reporting any adverse effects and following up as directed.  Patient is reporting trazodone 50 mg tablet by mouth as needed for sleep is working for insomnia.  Patient is compliant with medication administration, compliance with medication has been good and follows up as directed.  Past Medical History:  Diagnosis Date  . Hypertension   . Insomnia   . Schizoaffective disorder ([facility]) 02/05/2014      Family History  Problem Relation Age of Onset  . Hypertension Mother   . Asthma Father   . Hypertension Father   . Heart disease Maternal Grandmother     Social History   Socioeconomic History  . Marital status: Single    Spouse name: Not on file  . Number of children: Not on file  . Years of education: Not on file  . Highest education level: Not on file  Occupational History  . Occupation: Games developer  Tobacco Use  . Smoking status: Current Some Day Smoker    Types: Cigarettes  . Smokeless tobacco: Never Used  Vaping Use  . Vaping Use: Every day  . Substances: Nicotine  Substance and Sexual Activity  . Alcohol use: Yes    Comment: occ  . Drug use: Never  . Sexual activity: Not on file  Other Topics Concern  . Not on file  Social History Narrative  . Not on file   Social Determinants of Health   Financial Resource Strain:   . Difficulty of Paying Living Expenses: Not on file  Food Insecurity:   . Worried About Programme researcher, broadcasting/film/video  in the Last Year: Not on file  . Ran Out of Food in the Last Year: Not on file  Transportation Needs:   . Lack of Transportation (Medical): Not on file  . Lack of Transportation (Non-Medical): Not on file  Physical Activity:   . Days of Exercise per Week: Not on file  . Minutes of Exercise per Session: Not on file  Stress:   . Feeling of Stress : Not on file  Social Connections:   . Frequency of Communication with Friends and Family: Not on file  . Frequency of Social Gatherings with Friends and Family: Not on file  . Attends Religious Services: Not on file  . Active Member of Clubs or Organizations: Not on file  . Attends Banker Meetings: Not on file  . Marital Status: Not on file  Intimate Partner Violence:   . Fear of Current or Ex-Partner: Not on file  . Emotionally Abused: Not on file  . Physically Abused: Not on file  . Sexually Abused: Not on file    Outpatient Medications Prior to Visit  Medication Sig Dispense Refill  . atomoxetine (STRATTERA) 40 MG capsule Take 1 capsule (40 mg total) by mouth daily. 30 capsule 2  . thiothixene (NAVANE) 5 MG capsule TAKE (1) CAPSULE DAILY 90 capsule 3  . traZODone (DESYREL) 50 MG  tablet Take 50 mg by mouth at bedtime as needed x3 days, may increase to 100 mg (2 tablets) if needed after 3 days. 30 tablet 2   No facility-administered medications prior to visit.    No Known Allergies  ROS Review of Systems  Neurological: Negative for light-headedness and headaches.  Psychiatric/Behavioral: The patient is not nervous/anxious.   All other systems reviewed and are negative.     Objective:    Physical Exam Vitals reviewed.  Constitutional:      Appearance: Normal appearance.  HENT:     Head: Normocephalic.     Mouth/Throat:     Mouth: Mucous membranes are moist.     Pharynx: Oropharynx is clear.  Eyes:     Conjunctiva/sclera: Conjunctivae normal.  Cardiovascular:     Rate and Rhythm: Normal rate and regular  rhythm.     Pulses: Normal pulses.     Heart sounds: Normal heart sounds.  Pulmonary:     Effort: Pulmonary effort is normal.     Breath sounds: Normal breath sounds.  Abdominal:     General: Bowel sounds are normal.  Musculoskeletal:        General: Normal range of motion.  Skin:    General: Skin is warm.  Neurological:     Mental Status: He is alert and oriented to person, place, and time.  Psychiatric:        Mood and Affect: Mood normal.        Behavior: Behavior normal.     BP (!) 141/83   Pulse 85   Temp 98.5 F (36.9 C) (Temporal)   Ht 6\' 1"  (1.854 m)   Wt 233 lb (105.7 kg)   SpO2 97%   BMI 30.74 kg/m  Wt Readings from Last 3 Encounters:  04/30/20 233 lb (105.7 kg)  02/05/20 236 lb (107 kg)  02/01/19 228 lb 9.6 oz (103.7 kg)     Health Maintenance Due  Topic Date Due  . COVID-19 Vaccine (1) Never done  . INFLUENZA VACCINE  01/19/2020    There are no preventive care reminders to display for this patient.  No results found for: TSH Lab Results  Component Value Date   WBC 6.2 02/05/2020   HGB 16.0 02/05/2020   HCT 47.4 02/05/2020   MCV 92 02/05/2020   PLT 246 02/05/2020   Lab Results  Component Value Date   NA 141 02/05/2020   K 3.6 02/05/2020   CO2 28 02/05/2020   GLUCOSE 87 02/05/2020   BUN 11 02/05/2020   CREATININE 1.16 02/05/2020   BILITOT 0.4 02/05/2020   ALKPHOS 64 02/05/2020   AST 40 02/05/2020   ALT 35 02/05/2020   PROT 6.7 02/05/2020   ALBUMIN 4.4 02/05/2020   CALCIUM 9.5 02/05/2020   Lab Results  Component Value Date   CHOL 152 02/05/2020   Lab Results  Component Value Date   HDL 33 (L) 02/05/2020   Lab Results  Component Value Date   LDLCALC 96 02/05/2020   Lab Results  Component Value Date   TRIG 127 02/05/2020   Lab Results  Component Value Date   CHOLHDL 4.6 02/05/2020   No results found for: HGBA1C    Assessment & Plan:   Problem List Items Addressed This Visit      Other   Insomnia    Medication  effective for insomnia no changes necessary. Refill sent to pharmacy.      Relevant Medications   traZODone (DESYREL) 50 MG tablet  Difficulty concentrating - Primary    Patient is reporting improved concentration with Strattera.  No changes to current medication dose.  Continue to provide education to patient with printed handouts given.  Follow-up in 3 months. Refill sent to pharmacy.      Relevant Medications   atomoxetine (STRATTERA) 40 MG capsule      Meds ordered this encounter  Medications  . atomoxetine (STRATTERA) 40 MG capsule    Sig: Take 1 capsule (40 mg total) by mouth daily.    Dispense:  30 capsule    Refill:  2    Order Specific Question:   Supervising Provider    Answer:   Arville Care A [id number]  . traZODone (DESYREL) 50 MG tablet    Sig: Take 50 mg by mouth at bedtime as needed x3 days, may increase to 100 mg (2 tablets) if needed after 3 days.    Dispense:  30 tablet    Refill:  2    Order Specific Question:   Supervising Provider    Answer:   Arville Care A [id number]    Follow-up: Return in about 3 months (around 07/31/2020).    Daryll Drown, NP

## 2020-04-30 NOTE — Assessment & Plan Note (Signed)
Patient is reporting improved concentration with Strattera.  No changes to current medication dose.  Continue to provide education to patient with printed handouts given.  Follow-up in 3 months. Refill sent to pharmacy.

## 2020-04-30 NOTE — Patient Instructions (Addendum)
No changes  to current medication. Refill sent to pharmacy for trazodone and Strattera Follow in 3 months   Attention Deficit Hyperactivity Disorder, Adult Attention deficit hyperactivity disorder (ADHD) is a mental health disorder that starts during childhood (neurodevelopmental disorder). For many people with ADHD, the disorder continues into the adult years. Treatment can help you manage your symptoms. What are the causes? The exact cause of ADHD is not known. Most experts believe genetics and environmental factors contribute to ADHD. What increases the risk? The following factors may make you more likely to develop this condition:  Having a family history of ADHD.  Being male.  Being born to a mother who smoked or drank alcohol during pregnancy.  Being exposed to lead or other toxins in the womb or early in life.  Being born before 37 weeks of pregnancy (prematurely) or at a low birth weight.  Having experienced a brain injury. What are the signs or symptoms? Symptoms of this condition depend on the type of ADHD. The two main types are inattentive and hyperactive-impulsive. Some people may have symptoms of both types. Symptoms of the inattentive type include:  Difficulty paying attention.  Making careless mistakes.  Not following instructions.  Being disorganized.  Avoiding tasks that require time and attention.  Losing and forgetting things.  Being easily distracted. Symptoms of the hyperactive-impulsive type include:  Restlessness.  Talking too much.  Interrupting.  Difficulty with: ? Sitting still. ? Feeling motivated. ? Relaxing. ? Waiting in line or waiting for a turn. In adults, this condition may lead to certain problems, such as:  Keeping jobs.  Performing tasks at work.  Having stable relationships.  Being on time or keeping to a schedule. How is this diagnosed? This condition is diagnosed based on your current symptoms and your history of  symptoms. The diagnosis can be made by a health care provider such as a primary care provider or a mental health care specialist. Your health care provider may use a symptom checklist or a behavior rating scale to evaluate your symptoms. He or she may also want to talk with people who have observed your behaviors throughout your life. How is this treated? This condition can be treated with medicines and behavior therapy. Medicines may be the best option to reduce impulsive behaviors and improve attention. Your health care provider may recommend:  Stimulant medicines. These are the most common medicines used for adult ADHD. They affect certain chemicals in the brain (neurotransmitters) and improve your ability to control your symptoms.  A non-stimulant medicine for adult ADHD (atomoxetine). This medicine increases a neurotransmitter called norepinephrine. It may take weeks to months to see effects from this medicine. Counseling and behavioral management are also important for treating ADHD. Counseling is often used along with medicine. Your health care provider may suggest:  Cognitive behavioral therapy (CBT). This type of therapy teaches you to replace negative thoughts and actions with positive thoughts and actions. When used as part of ADHD treatment, this therapy may also include: ? Coping strategies for organization, time management, impulse control, and stress reduction. ? Mindfulness and meditation training.  Behavioral management. You may work with a Psychologist, occupational who is specially trained to help people with ADHD manage and organize activities and function more effectively. Follow these instructions at home: Medicines   Take over-the-counter and prescription medicines only as told by your health care provider.  Talk with your health care provider about the possible side effects of your medicines and how to manage them.  Lifestyle   Do not use drugs.  Do not drink alcohol if: ? Your health  care provider tells you not to drink. ? You are pregnant, may be pregnant, or are planning to become pregnant.  If you drink alcohol: ? Limit how much you use to:  0-1 drink a day for women.  0-2 drinks a day for men. ? Be aware of how much alcohol is in your drink. In the [place] one drink equals one 12 oz bottle of beer (355 mL), one 5 oz glass of wine (148 mL), or one 1 oz glass of hard liquor (44 mL).  Get enough sleep.  Eat a healthy diet.  Exercise regularly. Exercise can help to reduce stress and anxiety. General instructions  Learn as much as you can about adult ADHD, and work closely with your health care providers to find the treatments that work best for you.  Follow the same schedule each day.  Use reminder devices like notes, calendars, and phone apps to stay on time and organized.  Keep all follow-up visits as told by your health care provider and therapist. This is important. Where to find more information A health care provider may be able to recommend resources that are available online or over the phone. You could start with:  Attention Deficit Disorder Association (ADDA): http://davis-dillon.net/  [employer] of Mental Health [facility]): http://www.maynard.net/ Contact a health care provider if:  Your symptoms continue to cause problems.  You have side effects from your medicine, such as: ? Repeated muscle twitches, coughing, or speech outbursts. ? Sleep problems. ? Loss of appetite. ? Dizziness. ? Unusually fast heartbeat. ? Stomach pains. ? Headaches.  You are struggling with anxiety, depression, or substance abuse. Get help right away if you:  Have a severe reaction to a medicine. If you ever feel like you may hurt yourself or others, or have thoughts about taking your own life, get help right away. You can go to the nearest emergency department or call:  Your local emergency services (911 in the [place]  A suicide crisis helpline, such as the National Suicide  Prevention Lifeline at 684-298-1540. This is open 24 hours a day. Summary  ADHD is a mental health disorder that starts during childhood (neurodevelopmental disorder) and often continues into the adult years.  The exact cause of ADHD is not known. Most experts believe genetics and environmental factors contribute to ADHD.  There is no cure for ADHD, but treatment with medicine, cognitive behavioral therapy, or behavioral management can help you manage your condition. This information is not intended to replace advice given to you by your health care provider. Make sure you discuss any questions you have with your health care provider. Document Revised: 10/29/2018 Document Reviewed: 10/29/2018 Elsevier Patient Education  2020 Elsevier Inc.   Insomnia Insomnia is a sleep disorder that makes it difficult to fall asleep or stay asleep. Insomnia can cause fatigue, low energy, difficulty concentrating, mood swings, and poor performance at work or school. There are three different ways to classify insomnia:  Difficulty falling asleep.  Difficulty staying asleep.  Waking up too early in the morning. Any type of insomnia can be long-term (chronic) or short-term (acute). Both are common. Short-term insomnia usually lasts for three months or less. Chronic insomnia occurs at least three times a week for longer than three months. What are the causes? Insomnia may be caused by another condition, situation, or substance, such as:  Anxiety.  Certain medicines.  Gastroesophageal reflux disease (  GERD) or other gastrointestinal conditions.  Asthma or other breathing conditions.  Restless legs syndrome, sleep apnea, or other sleep disorders.  Chronic pain.  Menopause.  Stroke.  Abuse of alcohol, tobacco, or illegal drugs.  Mental health conditions, such as depression.  Caffeine.  Neurological disorders, such as Alzheimer's disease.  An overactive thyroid  (hyperthyroidism). Sometimes, the cause of insomnia may not be known. What increases the risk? Risk factors for insomnia include:  Gender. Women are affected more often than men.  Age. Insomnia is more common as you get older.  Stress.  Lack of exercise.  Irregular work schedule or working night shifts.  Traveling between different time zones.  Certain medical and mental health conditions. What are the signs or symptoms? If you have insomnia, the main symptom is having trouble falling asleep or having trouble staying asleep. This may lead to other symptoms, such as:  Feeling fatigued or having low energy.  Feeling nervous about going to sleep.  Not feeling rested in the morning.  Having trouble concentrating.  Feeling irritable, anxious, or depressed. How is this diagnosed? This condition may be diagnosed based on:  Your symptoms and medical history. Your health care provider may ask about: ? Your sleep habits. ? Any medical conditions you have. ? Your mental health.  A physical exam. How is this treated? Treatment for insomnia depends on the cause. Treatment may focus on treating an underlying condition that is causing insomnia. Treatment may also include:  Medicines to help you sleep.  Counseling or therapy.  Lifestyle adjustments to help you sleep better. Follow these instructions at home: Eating and drinking   Limit or avoid alcohol, caffeinated beverages, and cigarettes, especially close to bedtime. These can disrupt your sleep.  Do not eat a large meal or eat spicy foods right before bedtime. This can lead to digestive discomfort that can make it hard for you to sleep. Sleep habits   Keep a sleep diary to help you and your health care provider figure out what could be causing your insomnia. Write down: ? When you sleep. ? When you wake up during the night. ? How well you sleep. ? How rested you feel the next day. ? Any side effects of medicines you  are taking. ? What you eat and drink.  Make your bedroom a dark, comfortable place where it is easy to fall asleep. ? Put up shades or blackout curtains to block light from outside. ? Use a white noise machine to block noise. ? Keep the temperature cool.  Limit screen use before bedtime. This includes: ? Watching TV. ? Using your smartphone, tablet, or computer.  Stick to a routine that includes going to bed and waking up at the same times every day and night. This can help you fall asleep faster. Consider making a quiet activity, such as reading, part of your nighttime routine.  Try to avoid taking naps during the day so that you sleep better at night.  Get out of bed if you are still awake after 15 minutes of trying to sleep. Keep the lights down, but try reading or doing a quiet activity. When you feel sleepy, go back to bed. General instructions  Take over-the-counter and prescription medicines only as told by your health care provider.  Exercise regularly, as told by your health care provider. Avoid exercise starting several hours before bedtime.  Use relaxation techniques to manage stress. Ask your health care provider to suggest some techniques that may work well  for you. These may include: ? Breathing exercises. ? Routines to release muscle tension. ? Visualizing peaceful scenes.  Make sure that you drive carefully. Avoid driving if you feel very sleepy.  Keep all follow-up visits as told by your health care provider. This is important. Contact a health care provider if:  You are tired throughout the day.  You have trouble in your daily routine due to sleepiness.  You continue to have sleep problems, or your sleep problems get worse. Get help right away if:  You have serious thoughts about hurting yourself or someone else. If you ever feel like you may hurt yourself or others, or have thoughts about taking your own life, get help right away. You can go to your nearest  emergency department or call:  Your local emergency services (911 in the [place]  A suicide crisis helpline, such as the National Suicide Prevention Lifeline at 904-656-44091-(250)137-3134. This is open 24 hours a day. Summary  Insomnia is a sleep disorder that makes it difficult to fall asleep or stay asleep.  Insomnia can be long-term (chronic) or short-term (acute).  Treatment for insomnia depends on the cause. Treatment may focus on treating an underlying condition that is causing insomnia.  Keep a sleep diary to help you and your health care provider figure out what could be causing your insomnia. This information is not intended to replace advice given to you by your health care provider. Make sure you discuss any questions you have with your health care provider. Document Revised: 05/19/2017 Document Reviewed: 03/16/2017 Elsevier Patient Education  2020 [employer]

## 2020-07-30 ENCOUNTER — Ambulatory Visit: Payer: BC Managed Care – PPO | Admitting: Family Medicine

## 2020-08-03 ENCOUNTER — Other Ambulatory Visit: Payer: Self-pay | Admitting: *Deleted

## 2020-08-03 DIAGNOSIS — R41840 Attention and concentration deficit: Secondary | ICD-10-CM

## 2020-08-04 MED ORDER — ATOMOXETINE HCL 40 MG PO CAPS: 40.0000 mg | ORAL_CAPSULE | Freq: Every day | ORAL | 1 refills | Status: DC

## 2020-08-05 ENCOUNTER — Encounter: Payer: Self-pay | Admitting: Family Medicine

## 2020-10-15 DIAGNOSIS — R45851 Suicidal ideations: Secondary | ICD-10-CM | POA: Diagnosis not present

## 2020-10-15 DIAGNOSIS — Z639 Problem related to primary support group, unspecified: Secondary | ICD-10-CM | POA: Diagnosis not present

## 2020-10-15 DIAGNOSIS — F32A Depression, unspecified: Secondary | ICD-10-CM | POA: Diagnosis not present

## 2020-10-15 DIAGNOSIS — F259 Schizoaffective disorder, unspecified: Secondary | ICD-10-CM | POA: Diagnosis not present

## 2020-10-28 DIAGNOSIS — F902 Attention-deficit hyperactivity disorder, combined type: Secondary | ICD-10-CM | POA: Diagnosis not present

## 2020-10-28 DIAGNOSIS — F259 Schizoaffective disorder, unspecified: Secondary | ICD-10-CM | POA: Diagnosis not present

## 2020-11-02 DIAGNOSIS — F259 Schizoaffective disorder, unspecified: Secondary | ICD-10-CM | POA: Diagnosis not present

## 2020-11-02 DIAGNOSIS — F902 Attention-deficit hyperactivity disorder, combined type: Secondary | ICD-10-CM | POA: Diagnosis not present

## 2020-11-09 DIAGNOSIS — F902 Attention-deficit hyperactivity disorder, combined type: Secondary | ICD-10-CM | POA: Diagnosis not present

## 2020-11-09 DIAGNOSIS — F259 Schizoaffective disorder, unspecified: Secondary | ICD-10-CM | POA: Diagnosis not present

## 2020-11-16 DIAGNOSIS — F259 Schizoaffective disorder, unspecified: Secondary | ICD-10-CM | POA: Diagnosis not present

## 2020-11-16 DIAGNOSIS — F902 Attention-deficit hyperactivity disorder, combined type: Secondary | ICD-10-CM | POA: Diagnosis not present

## 2020-11-23 DIAGNOSIS — F259 Schizoaffective disorder, unspecified: Secondary | ICD-10-CM | POA: Diagnosis not present

## 2020-11-23 DIAGNOSIS — F902 Attention-deficit hyperactivity disorder, combined type: Secondary | ICD-10-CM | POA: Diagnosis not present

## 2020-11-30 DIAGNOSIS — F259 Schizoaffective disorder, unspecified: Secondary | ICD-10-CM | POA: Diagnosis not present

## 2020-11-30 DIAGNOSIS — F902 Attention-deficit hyperactivity disorder, combined type: Secondary | ICD-10-CM | POA: Diagnosis not present

## 2020-12-07 DIAGNOSIS — F902 Attention-deficit hyperactivity disorder, combined type: Secondary | ICD-10-CM | POA: Diagnosis not present

## 2020-12-07 DIAGNOSIS — F259 Schizoaffective disorder, unspecified: Secondary | ICD-10-CM | POA: Diagnosis not present

## 2020-12-13 DIAGNOSIS — F250 Schizoaffective disorder, bipolar type: Secondary | ICD-10-CM | POA: Diagnosis not present

## 2020-12-14 DIAGNOSIS — F902 Attention-deficit hyperactivity disorder, combined type: Secondary | ICD-10-CM | POA: Diagnosis not present

## 2020-12-14 DIAGNOSIS — F250 Schizoaffective disorder, bipolar type: Secondary | ICD-10-CM | POA: Diagnosis not present

## 2020-12-17 DIAGNOSIS — F250 Schizoaffective disorder, bipolar type: Secondary | ICD-10-CM | POA: Diagnosis not present

## 2021-01-29 ENCOUNTER — Other Ambulatory Visit: Payer: Self-pay | Admitting: Family Medicine

## 2021-01-29 DIAGNOSIS — R41840 Attention and concentration deficit: Secondary | ICD-10-CM

## 2021-01-30 DIAGNOSIS — F250 Schizoaffective disorder, bipolar type: Secondary | ICD-10-CM | POA: Diagnosis not present

## 2021-02-11 ENCOUNTER — Other Ambulatory Visit: Payer: Self-pay

## 2021-02-11 ENCOUNTER — Ambulatory Visit (INDEPENDENT_AMBULATORY_CARE_PROVIDER_SITE_OTHER): Payer: BC Managed Care – PPO | Admitting: Family Medicine

## 2021-02-11 ENCOUNTER — Encounter: Payer: Self-pay | Admitting: Family Medicine

## 2021-02-11 VITALS — BP 149/90 | HR 82 | Temp 97.6°F | Ht 73.0 in | Wt 247.0 lb

## 2021-02-11 DIAGNOSIS — R41840 Attention and concentration deficit: Secondary | ICD-10-CM

## 2021-02-11 DIAGNOSIS — I10 Essential (primary) hypertension: Secondary | ICD-10-CM | POA: Diagnosis not present

## 2021-02-11 DIAGNOSIS — F259 Schizoaffective disorder, unspecified: Secondary | ICD-10-CM

## 2021-02-11 DIAGNOSIS — Z0001 Encounter for general adult medical examination with abnormal findings: Secondary | ICD-10-CM

## 2021-02-11 DIAGNOSIS — Z0000 Encounter for general adult medical examination without abnormal findings: Secondary | ICD-10-CM | POA: Diagnosis not present

## 2021-02-11 LAB — MICROSCOPIC EXAMINATION: RBC: NONE SEEN /hpf (ref 0–2)

## 2021-02-11 LAB — URINALYSIS, ROUTINE W REFLEX MICROSCOPIC
Bilirubin, UA: NEGATIVE
Glucose, UA: NEGATIVE
Ketones, UA: NEGATIVE
Leukocytes,UA: NEGATIVE
Nitrite, UA: NEGATIVE
Protein,UA: NEGATIVE
Specific Gravity, UA: >1.03 — ABNORMAL HIGH (ref 1.005–1.030)
Urobilinogen, Ur: 0.2 mg/dL (ref 0.2–1.0)
pH, UA: 5.5 (ref 5.0–7.5)

## 2021-02-11 MED ORDER — AMLODIPINE BESYLATE 5 MG PO TABS: 5.0000 mg | ORAL_TABLET | Freq: Every day | ORAL | 2 refills | Status: DC

## 2021-02-11 MED ORDER — ATOMOXETINE HCL 40 MG PO CAPS: 40.0000 mg | ORAL_CAPSULE | Freq: Every day | ORAL | 3 refills | Status: DC

## 2021-02-11 NOTE — Patient Instructions (Signed)
Reminder: please return after mid-September for your flu shot. If you receive this elsewhere, such as your pharmacy, please let us know so we can get this documented in your chart.   

## 2021-02-11 NOTE — Progress Notes (Signed)
Assessment & Plan:  1. Well adult exam - preventative health information provided - encouraged healthy diet and exercise - CBC with Differential/Platelet - CMP14+EGFR - Lipid panel  2. Schizoaffective disorder, unspecified type ([place]) - followed by Azzie Glatter with psychiatry - Well controlled on current regimen, continue thiothixene per psychiatry - will fax labs to psychiatrist when resulted - Thyroid Panel With TSH - CK - Prolactin - Urinalysis, Routine w reflex microscopic  3. Difficulty concentrating -Well controlled on current regimen, continue straterra as prescribed - atomoxetine (STRATTERA) 40 MG capsule; Take 1 capsule (40 mg total) by mouth daily.  Dispense: 90 capsule; Refill: 3  4. Essential hypertension - added amlodipine - encouraged to take BP at home and keep log - encouraged healthy diet and exercise - amLODipine (NORVASC) 5 MG tablet; Take 1 tablet (5 mg total) by mouth daily.  Dispense: 30 tablet; Refill: 2   Follow-up: Return in about 4 weeks (around 03/11/2021) for HTN and 1 year for CPE.   Lucile Crater, NP Student  Subjective:  Patient ID: Kenneth Dominguez, male    DOB: 19-Jul-1990  Age: 30 y.o. MRN: [id number]  Patient Care Team: Loman Brooklyn, FNP as PCP - General (Family Medicine) Azzie Glatter, NP as Consulting Physician (Psychiatry)   CC:  Chief Complaint  Patient presents with   Hypertension   Insomnia    Check up of chronic medical conditions     HPI Kenneth Dominguez presents for his annual physical.  Occupation: Engineer, technical sales, Marital status: engaged, Substance use: none Diet: keto from time to time, Exercise: some at the gym Last eye exam: within the past year Last dental exam: 2 years ago Immunizations: Flu Vaccine:  not yet availabe, but will get when it is Tdap Vaccine: up to date  COVID-19 Vaccine: up to date - patient will call back with dates  DEPRESSION SCREENING PHQ 2/9 Scores 02/11/2021 04/30/2020  02/05/2020 02/01/2019  PHQ - 2 Score 0 0 0 0  PHQ- 9 Score 3 - - -    GAD 7 : Generalized Anxiety Score 02/11/2021  Nervous, Anxious, on Edge 0  Control/stop worrying 0  Worry too much - different things 0  Trouble relaxing 1  Restless 0  Easily annoyed or irritable 1  Afraid - awful might happen 0  Total GAD 7 Score 2  Anxiety Difficulty Not difficult at all    Patient reports he has been taking strattera and tolerating it well. He states he has seen his psychiatrist who increased his thiothixene to 10 mg TID and is tolerating it well. He is requesting lab work on behalf of his psychiatrist which includes a CBC, CMP, lipids, thyroid panel, CK, prolactin, and a UA.  He reports he occasionally has thoughts that he would be better off dead, which has improved since his psychiatrist increased his thiothixene. He does not have a plan. He is not actively having these thoughts.   He does not take his blood pressures at home. He states he was put on amlodipine when he was 26 but was able to stop taking it 2 years ago due to diet and lifestyle changes.   Review of Systems  Constitutional:  Negative for chills, fever, malaise/fatigue and weight loss.  HENT:  Negative for congestion, ear discharge, ear pain, nosebleeds, sinus pain, sore throat and tinnitus.   Eyes:  Negative for blurred vision, double vision, pain, discharge and redness.  Respiratory:  Negative for cough, shortness of breath and wheezing.  Cardiovascular:  Negative for chest pain, palpitations and leg swelling.  Gastrointestinal:  Negative for abdominal pain, constipation, diarrhea, heartburn, nausea and vomiting.  Genitourinary:  Negative for dysuria, frequency and urgency.  Musculoskeletal:  Negative for myalgias.  Skin:  Negative for rash.  Neurological:  Negative for dizziness, seizures, weakness and headaches.  Psychiatric/Behavioral:  Negative for depression, substance abuse and suicidal ideas. The patient is not  nervous/anxious.    Current Outpatient Medications:    amLODipine (NORVASC) 5 MG tablet, Take 1 tablet (5 mg total) by mouth daily., Disp: 30 tablet, Rfl: 2   thiothixene (NAVANE) 10 MG capsule, Take 10 mg by mouth 3 (three) times daily., Disp: , Rfl:    atomoxetine (STRATTERA) 40 MG capsule, Take 1 capsule (40 mg total) by mouth daily., Disp: 90 capsule, Rfl: 3  No Known Allergies  Past Medical History:  Diagnosis Date   Hypertension    Insomnia    Schizoaffective disorder ([place]) 02/05/2014    History reviewed. No pertinent surgical history.  Family History  Problem Relation Age of Onset   Hypertension Mother    Asthma Father    Hypertension Father    Heart disease Maternal Grandmother     Social History   Socioeconomic History   Marital status: Single    Spouse name: Not on file   Number of children: Not on file   Years of education: Not on file   Highest education level: Not on file  Occupational History   Occupation: Psychologist, prison and probation services  Tobacco Use   Smoking status: Some Days    Types: Cigarettes   Smokeless tobacco: Never  Vaping Use   Vaping Use: Every day   Substances: Nicotine  Substance and Sexual Activity   Alcohol use: Yes    Comment: occ   Drug use: Never   Sexual activity: Not on file  Other Topics Concern   Not on file  Social History Narrative   Not on file   Social Determinants of Health   Financial Resource Strain: Not on file  Food Insecurity: Not on file  Transportation Needs: Not on file  Physical Activity: Not on file  Stress: Not on file  Social Connections: Not on file  Intimate Partner Violence: Not on file      Objective:    BP (!) 149/90   Pulse 82   Temp 97.6 F (36.4 C) (Temporal)   Ht 6' 1"  (1.854 m)   Wt 112 kg   SpO2 97%   BMI 32.59 kg/m   Wt Readings from Last 3 Encounters:  02/11/21 112 kg  04/30/20 105.7 kg  02/05/20 107 kg    Physical Exam Vitals reviewed.  Constitutional:      General: He is not in  acute distress.    Appearance: Normal appearance. He is obese. He is not ill-appearing, toxic-appearing or diaphoretic.  HENT:     Head: Normocephalic and atraumatic.     Right Ear: Tympanic membrane, ear canal and external ear normal. There is no impacted cerumen.     Left Ear: Tympanic membrane, ear canal and external ear normal. There is no impacted cerumen.     Nose: Nose normal. No congestion or rhinorrhea.     Mouth/Throat:     Mouth: Mucous membranes are moist.     Pharynx: Oropharynx is clear. No oropharyngeal exudate or posterior oropharyngeal erythema.  Eyes:     General: No scleral icterus.       Right eye: No discharge.  Left eye: No discharge.     Conjunctiva/sclera: Conjunctivae normal.     Pupils: Pupils are equal, round, and reactive to light.  Cardiovascular:     Rate and Rhythm: Normal rate and regular rhythm.     Pulses: Normal pulses.     Heart sounds: Normal heart sounds. No murmur heard.   No friction rub. No gallop.  Pulmonary:     Effort: Pulmonary effort is normal. No respiratory distress.     Breath sounds: Normal breath sounds. No stridor. No wheezing, rhonchi or rales.  Abdominal:     General: Abdomen is flat. Bowel sounds are normal. There is no distension.     Palpations: Abdomen is soft. There is no mass.     Tenderness: There is no abdominal tenderness. There is no guarding or rebound.     Hernia: No hernia is present.  Musculoskeletal:        General: Normal range of motion.     Cervical back: Normal range of motion and neck supple. No rigidity. No muscular tenderness.     Right lower leg: No edema.     Left lower leg: No edema.  Lymphadenopathy:     Cervical: No cervical adenopathy.  Skin:    General: Skin is warm and dry.     Capillary Refill: Capillary refill takes less than 2 seconds.  Neurological:     General: No focal deficit present.     Mental Status: He is alert and oriented to person, place, and time. Mental status is at  baseline.  Psychiatric:        Mood and Affect: Mood normal.        Behavior: Behavior normal.        Thought Content: Thought content normal.        Judgment: Judgment normal.    No results found for: TSH Lab Results  Component Value Date   WBC 6.2 02/05/2020   HGB 16.0 02/05/2020   HCT 47.4 02/05/2020   MCV 92 02/05/2020   PLT 246 02/05/2020   Lab Results  Component Value Date   NA 141 02/05/2020   K 3.6 02/05/2020   CO2 28 02/05/2020   GLUCOSE 87 02/05/2020   BUN 11 02/05/2020   CREATININE 1.16 02/05/2020   BILITOT 0.4 02/05/2020   ALKPHOS 64 02/05/2020   AST 40 02/05/2020   ALT 35 02/05/2020   PROT 6.7 02/05/2020   ALBUMIN 4.4 02/05/2020   CALCIUM 9.5 02/05/2020   Lab Results  Component Value Date   CHOL 152 02/05/2020   Lab Results  Component Value Date   HDL 33 (L) 02/05/2020   Lab Results  Component Value Date   LDLCALC 96 02/05/2020   Lab Results  Component Value Date   TRIG 127 02/05/2020   Lab Results  Component Value Date   CHOLHDL 4.6 02/05/2020   No results found for: HGBA1C

## 2021-02-12 LAB — CBC WITH DIFFERENTIAL/PLATELET
Basophils Absolute: 0.1 10*3/uL (ref 0.0–0.2)
Basos: 1 %
EOS (ABSOLUTE): 0.2 10*3/uL (ref 0.0–0.4)
Eos: 3 %
Hematocrit: 46.1 % (ref 37.5–51.0)
Hemoglobin: 16 g/dL (ref 13.0–17.7)
Immature Grans (Abs): 0 10*3/uL (ref 0.0–0.1)
Immature Granulocytes: 0 %
Lymphocytes Absolute: 2.4 10*3/uL (ref 0.7–3.1)
Lymphs: 43 %
MCH: 29.9 pg (ref 26.6–33.0)
MCHC: 34.7 g/dL (ref 31.5–35.7)
MCV: 86 fL (ref 79–97)
Monocytes Absolute: 0.5 10*3/uL (ref 0.1–0.9)
Monocytes: 10 %
Neutrophils Absolute: 2.4 10*3/uL (ref 1.4–7.0)
Neutrophils: 43 %
Platelets: 254 10*3/uL (ref 150–450)
RBC: 5.36 x10E6/uL (ref 4.14–5.80)
RDW: 11.8 % (ref 11.6–15.4)
WBC: 5.6 10*3/uL (ref 3.4–10.8)

## 2021-02-12 LAB — CMP14+EGFR
ALT: 49 IU/L — ABNORMAL HIGH (ref 0–44)
AST: 40 IU/L (ref 0–40)
Albumin/Globulin Ratio: 2 (ref 1.2–2.2)
Albumin: 4.8 g/dL (ref 4.0–5.0)
Alkaline Phosphatase: 59 IU/L (ref 44–121)
BUN/Creatinine Ratio: 12 (ref 9–20)
BUN: 14 mg/dL (ref 6–20)
Bilirubin Total: 0.2 mg/dL (ref 0.0–1.2)
CO2: 24 mmol/L (ref 20–29)
Calcium: 9.9 mg/dL (ref 8.7–10.2)
Chloride: 103 mmol/L (ref 96–106)
Creatinine, Ser: 1.19 mg/dL (ref 0.76–1.27)
Globulin, Total: 2.4 g/dL (ref 1.5–4.5)
Glucose: 99 mg/dL (ref 65–99)
Potassium: 4.3 mmol/L (ref 3.5–5.2)
Sodium: 142 mmol/L (ref 134–144)
Total Protein: 7.2 g/dL (ref 6.0–8.5)
eGFR: 84 mL/min/{1.73_m2} (ref >59)

## 2021-02-12 LAB — THYROID PANEL WITH TSH
Free Thyroxine Index: >4.5 (ref 1.2–4.9)
T3 Uptake Ratio: >56 % — ABNORMAL HIGH (ref 24–39)
T4, Total: 8.1 ug/dL (ref 4.5–12.0)
TSH: 1.89 u[IU]/mL (ref 0.450–4.500)

## 2021-02-12 LAB — LIPID PANEL
Chol/HDL Ratio: 5.2 ratio — ABNORMAL HIGH (ref 0.0–5.0)
Cholesterol, Total: 165 mg/dL (ref 100–199)
HDL: 32 mg/dL — ABNORMAL LOW (ref >39)
LDL Chol Calc (NIH): 121 mg/dL — ABNORMAL HIGH (ref 0–99)
Triglycerides: 62 mg/dL (ref 0–149)
VLDL Cholesterol Cal: 12 mg/dL (ref 5–40)

## 2021-02-12 LAB — PROLACTIN: Prolactin: 11.4 ng/mL (ref 4.0–15.2)

## 2021-02-12 LAB — CK: Total CK: 420 U/L (ref 49–439)

## 2021-02-25 ENCOUNTER — Other Ambulatory Visit: Payer: Self-pay | Admitting: Family Medicine

## 2021-02-25 NOTE — Telephone Encounter (Signed)
Patient gets medication from his psychiatrist, not me.

## 2021-02-26 NOTE — Telephone Encounter (Signed)
Patient aware and verbalizes understanding. 

## 2021-03-11 ENCOUNTER — Encounter: Payer: Self-pay | Admitting: Family Medicine

## 2021-03-11 ENCOUNTER — Other Ambulatory Visit: Payer: Self-pay | Admitting: Family Medicine

## 2021-03-11 ENCOUNTER — Ambulatory Visit: Payer: BC Managed Care – PPO | Admitting: Family Medicine

## 2021-05-20 DIAGNOSIS — F259 Schizoaffective disorder, unspecified: Secondary | ICD-10-CM | POA: Diagnosis not present

## 2021-05-20 DIAGNOSIS — F902 Attention-deficit hyperactivity disorder, combined type: Secondary | ICD-10-CM | POA: Diagnosis not present

## 2021-07-27 DIAGNOSIS — F250 Schizoaffective disorder, bipolar type: Secondary | ICD-10-CM | POA: Diagnosis not present

## 2021-07-27 DIAGNOSIS — F902 Attention-deficit hyperactivity disorder, combined type: Secondary | ICD-10-CM | POA: Diagnosis not present

## 2022-01-25 DIAGNOSIS — F250 Schizoaffective disorder, bipolar type: Secondary | ICD-10-CM | POA: Diagnosis not present

## 2022-01-25 DIAGNOSIS — F902 Attention-deficit hyperactivity disorder, combined type: Secondary | ICD-10-CM | POA: Diagnosis not present

## 2022-01-25 DIAGNOSIS — F259 Schizoaffective disorder, unspecified: Secondary | ICD-10-CM | POA: Diagnosis not present

## 2022-07-28 ENCOUNTER — Ambulatory Visit: Payer: BC Managed Care – PPO | Admitting: Family Medicine

## 2022-07-28 ENCOUNTER — Encounter: Payer: Self-pay | Admitting: Family Medicine

## 2022-07-28 DIAGNOSIS — F259 Schizoaffective disorder, unspecified: Secondary | ICD-10-CM

## 2022-07-28 DIAGNOSIS — Z79899 Other long term (current) drug therapy: Secondary | ICD-10-CM

## 2022-07-28 DIAGNOSIS — Z113 Encounter for screening for infections with a predominantly sexual mode of transmission: Secondary | ICD-10-CM | POA: Diagnosis not present

## 2022-07-28 DIAGNOSIS — Z0000 Encounter for general adult medical examination without abnormal findings: Secondary | ICD-10-CM

## 2022-07-28 DIAGNOSIS — Z0001 Encounter for general adult medical examination with abnormal findings: Secondary | ICD-10-CM | POA: Diagnosis not present

## 2022-07-28 DIAGNOSIS — I10 Essential (primary) hypertension: Secondary | ICD-10-CM | POA: Diagnosis not present

## 2022-07-28 LAB — HEPATITIS C ANTIBODY

## 2022-07-28 LAB — CBC WITH DIFFERENTIAL/PLATELET

## 2022-07-28 LAB — HIV ANTIBODY (ROUTINE TESTING W REFLEX)

## 2022-07-28 MED ORDER — AMLODIPINE BESYLATE 5 MG PO TABS: 5.0000 mg | ORAL_TABLET | Freq: Every day | ORAL | 1 refills | Status: DC

## 2022-07-28 NOTE — Patient Instructions (Signed)
Health Maintenance, Male Adopting a healthy lifestyle and getting preventive care are important in promoting health and wellness. Ask your health care provider about: The right schedule for you to have regular tests and exams. Things you can do on your own to prevent diseases and keep yourself healthy. What should I know about diet, weight, and exercise? Eat a healthy diet  Eat a diet that includes plenty of vegetables, fruits, low-fat dairy products, and lean protein. Do not eat a lot of foods that are high in solid fats, added sugars, or sodium. Maintain a healthy weight Body mass index (BMI) is a measurement that can be used to identify possible weight problems. It estimates body fat based on height and weight. Your health care provider can help determine your BMI and help you achieve or maintain a healthy weight. Get regular exercise Get regular exercise. This is one of the most important things you can do for your health. Most adults should: Exercise for at least 150 minutes each week. The exercise should increase your heart rate and make you sweat (moderate-intensity exercise). Do strengthening exercises at least twice a week. This is in addition to the moderate-intensity exercise. Spend less time sitting. Even light physical activity can be beneficial. Watch cholesterol and blood lipids Have your blood tested for lipids and cholesterol at 33 years of age, then have this test every 5 years. You may need to have your cholesterol levels checked more often if: Your lipid or cholesterol levels are high. You are older than 33 years of age. You are at high risk for heart disease. What should I know about cancer screening? Many types of cancers can be detected early and may often be prevented. Depending on your health history and family history, you may need to have cancer screening at various ages. This may include screening for: Colorectal cancer. Prostate cancer. Skin cancer. Lung  cancer. What should I know about heart disease, diabetes, and high blood pressure? Blood pressure and heart disease High blood pressure causes heart disease and increases the risk of stroke. This is more likely to develop in people who have high blood pressure readings or are overweight. Talk with your health care provider about your target blood pressure readings. Have your blood pressure checked: Every 3-5 years if you are 18-39 years of age. Every year if you are 40 years old or older. If you are between the ages of 65 and 75 and are a current or former smoker, ask your health care provider if you should have a one-time screening for abdominal aortic aneurysm (AAA). Diabetes Have regular diabetes screenings. This checks your fasting blood sugar level. Have the screening done: Once every three years after age 45 if you are at a normal weight and have a low risk for diabetes. More often and at a younger age if you are overweight or have a high risk for diabetes. What should I know about preventing infection? Hepatitis B If you have a higher risk for hepatitis B, you should be screened for this virus. Talk with your health care provider to find out if you are at risk for hepatitis B infection. Hepatitis C Blood testing is recommended for: Everyone born from 1945 through 1965. Anyone with known risk factors for hepatitis C. Sexually transmitted infections (STIs) You should be screened each year for STIs, including gonorrhea and chlamydia, if: You are sexually active and are younger than 33 years of age. You are older than 33 years of age and your   health care provider tells you that you are at risk for this type of infection. Your sexual activity has changed since you were last screened, and you are at increased risk for chlamydia or gonorrhea. Ask your health care provider if you are at risk. Ask your health care provider about whether you are at high risk for HIV. Your health care provider  may recommend a prescription medicine to help prevent HIV infection. If you choose to take medicine to prevent HIV, you should first get tested for HIV. You should then be tested every 3 months for as long as you are taking the medicine. Follow these instructions at home: Alcohol use Do not drink alcohol if your health care provider tells you not to drink. If you drink alcohol: Limit how much you have to 0-2 drinks a day. Know how much alcohol is in your drink. In the [place] one drink equals one 12 oz bottle of beer (355 mL), one 5 oz glass of wine (148 mL), or one 1 oz glass of hard liquor (44 mL). Lifestyle Do not use any products that contain nicotine or tobacco. These products include cigarettes, chewing tobacco, and vaping devices, such as e-cigarettes. If you need help quitting, ask your health care provider. Do not use street drugs. Do not share needles. Ask your health care provider for help if you need support or information about quitting drugs. General instructions Schedule regular health, dental, and eye exams. Stay current with your vaccines. Tell your health care provider if: You often feel depressed. You have ever been abused or do not feel safe at home. Summary Adopting a healthy lifestyle and getting preventive care are important in promoting health and wellness. Follow your health care provider's instructions about healthy diet, exercising, and getting tested or screened for diseases. Follow your health care provider's instructions on monitoring your cholesterol and blood pressure. This information is not intended to replace advice given to you by your health care provider. Make sure you discuss any questions you have with your health care provider. Document Revised: 10/26/2020 Document Reviewed: 10/26/2020 Elsevier Patient Education  2023 [employer]  

## 2022-07-28 NOTE — Progress Notes (Signed)
Complete physical exam  Patient: Kenneth Dominguez   DOB: 07-03-1990   32 y.o. Male  MRN: [id number]  Subjective:    Chief Complaint  Patient presents with   std screening    Kenneth Dominguez is a 32 y.o. male who presents today for a complete physical exam. He reports consuming a general diet. The patient has a physically strenuous job, but has no regular exercise apart from work.  He generally feels well. He reports sleeping well. He does not have additional problems to discuss today.   He is established with [facility] [place]. Sees them monthly. Feels like this is going well and that his symptoms are well controlled. They have requested an EKG due to his medication regimen.   He would also like to have STD screening done. He denies symptoms or known exposure.    Most recent fall risk assessment:    07/28/2022    4:12 PM  Fall Risk   Falls in the past year? 0     Most recent depression screenings:    07/28/2022    4:12 PM 02/11/2021   10:48 AM  PHQ 2/9 Scores  PHQ - 2 Score 0 0  PHQ- 9 Score 0 3    Vision:Not within last year  and Dental: No current dental problems  Past Medical History:  Diagnosis Date   Hypertension    Insomnia    Schizoaffective disorder ([place]) 02/05/2014      Patient Care Team: Gwenlyn Perking, FNP as PCP - General (Family Medicine) Azzie Glatter, NP as Consulting Physician (Psychiatry)   Outpatient Medications Prior to Visit  Medication Sig   atomoxetine (STRATTERA) 80 MG capsule Take 80 mg by mouth daily.   thiothixene (NAVANE) 10 MG capsule Take 10 mg by mouth 3 (three) times daily.   traZODone (DESYREL) 100 MG tablet Take 100 mg by mouth at bedtime as needed.   [DISCONTINUED] amLODipine (NORVASC) 5 MG tablet Take 1 tablet (5 mg total) by mouth daily. (Patient not taking: Reported on 07/28/2022)   [DISCONTINUED] atomoxetine (STRATTERA) 40 MG capsule Take 1 capsule (40 mg total) by mouth daily.   No facility-administered medications prior to  visit.    ROS Negative unless specially indicated above in HPI.     Objective:     BP 137/85   Pulse 78   Temp 97.9 F (36.6 C) (Temporal)   Ht 6\' 1"  (1.854 m)   Wt 257 lb 2 oz (116.6 kg)   SpO2 97%   BMI 33.92 kg/m    Physical Exam Vitals and nursing note reviewed.  Constitutional:      General: He is not in acute distress.    Appearance: He is not ill-appearing, toxic-appearing or diaphoretic.  HENT:     Head: Normocephalic.     Right Ear: Tympanic membrane, ear canal and external ear normal.     Left Ear: Tympanic membrane, ear canal and external ear normal.     Nose: Nose normal.     Mouth/Throat:     Mouth: Mucous membranes are moist.     Pharynx: Oropharynx is clear.  Eyes:     Extraocular Movements: Extraocular movements intact.     Conjunctiva/sclera: Conjunctivae normal.     Pupils: Pupils are equal, round, and reactive to light.  Neck:     Thyroid: No thyroid mass, thyromegaly or thyroid tenderness.  Cardiovascular:     Rate and Rhythm: Normal rate and regular rhythm.     Pulses:  Normal pulses.     Heart sounds: Normal heart sounds. No murmur heard.    No friction rub. No gallop.  Pulmonary:     Effort: Pulmonary effort is normal.     Breath sounds: Normal breath sounds.  Abdominal:     General: Bowel sounds are normal. There is no distension.     Palpations: Abdomen is soft. There is no mass.     Tenderness: There is no abdominal tenderness. There is no guarding.  Musculoskeletal:        General: No swelling.     Cervical back: Normal range of motion and neck supple. No tenderness.     Right lower leg: No edema.     Left lower leg: No edema.  Skin:    General: Skin is warm and dry.     Capillary Refill: Capillary refill takes less than 2 seconds.     Findings: No lesion or rash.  Neurological:     General: No focal deficit present.     Mental Status: He is alert and oriented to person, place, and time.  Psychiatric:        Mood and Affect:  Mood normal.        Behavior: Behavior normal.        Thought Content: Thought content normal.      No results found for any visits on 07/28/22.     Assessment & Plan:    Routine Health Maintenance and Physical Exam  Kenneth Dominguez was seen today for std screening and annual exam.  Diagnoses and all orders for this visit:  Routine general medical examination at a health care facility -     CBC with Differential/Platelet -     CMP14+EGFR -     TSH  Primary hypertension Discussed compliance with medications. Refills provided. Labs pending.  -     amLODipine (NORVASC) 5 MG tablet; Take 1 tablet (5 mg total) by mouth daily. -     CBC with Differential/Platelet -     CMP14+EGFR -     TSH  Screening examination for STD (sexually transmitted disease) Labs pending.  -     Ct Ng M genitalium NAA, Urine -     HIV Antibody (routine testing w rflx) -     Hepatitis C antibody  Schizoaffective disorder, unspecified type ([place]) Managed by [facility]. Copy of EKG given to give to [facility].   High risk medication use EKG with NSR. -     EKG 12-Lead   Immunization History  Administered Date(s) Administered   Influenza Inj Mdck Quad Pf 04/04/2019   Influenza-Unspecified 04/17/2020   PFIZER(Purple Top)SARS-COV-2 Vaccination 05/07/2020   Tdap 02/05/2018    Health Maintenance  Topic Date Due   HIV Screening  Never done   Hepatitis C Screening  Never done   COVID-19 Vaccine (2 - Pfizer risk series) 08/13/2022 (Originally 05/28/2020)   INFLUENZA VACCINE  09/18/2022 (Originally 01/18/2022)   DTaP/Tdap/Td (2 - Td or Tdap) 02/06/2028   HPV VACCINES  Aged Out    Discussed health benefits of physical activity, and encouraged him to engage in regular exercise appropriate for his age and condition.  Problem List Items Addressed This Visit       Cardiovascular and Mediastinum   Essential hypertension   Relevant Medications   amLODipine (NORVASC) 5 MG tablet     Other   Schizoaffective disorder ([place])    Other Visit Diagnoses     Routine general medical examination at a health care  facility       Relevant Orders   CBC with Differential/Platelet   CMP14+EGFR   TSH   Screening examination for STD (sexually transmitted disease)       Relevant Orders   Ct Ng M genitalium NAA, Urine   HIV Antibody (routine testing w rflx)   Hepatitis C antibody   High risk medication use       Relevant Orders   EKG 12-Lead (Completed)      Return in 6 months (on 01/26/2023) for chronic follow up.   The patient indicates understanding of these issues and agrees with the plan.  Gwenlyn Perking, FNP

## 2022-07-29 LAB — CBC WITH DIFFERENTIAL/PLATELET
Basophils Absolute: 0.1 10*3/uL (ref 0.0–0.2)
Basos: 1 %
EOS (ABSOLUTE): 0.2 10*3/uL (ref 0.0–0.4)
Eos: 2 %
Hematocrit: 43.5 % (ref 37.5–51.0)
Hemoglobin: 15.1 g/dL (ref 13.0–17.7)
Immature Grans (Abs): 0 10*3/uL (ref 0.0–0.1)
Immature Granulocytes: 0 %
Lymphocytes Absolute: 3.4 10*3/uL — ABNORMAL HIGH (ref 0.7–3.1)
Lymphs: 44 %
MCH: 29.7 pg (ref 26.6–33.0)
MCHC: 34.7 g/dL (ref 31.5–35.7)
MCV: 86 fL (ref 79–97)
Monocytes Absolute: 0.5 10*3/uL (ref 0.1–0.9)
Monocytes: 7 %
Neutrophils Absolute: 3.6 10*3/uL (ref 1.4–7.0)
Neutrophils: 46 %
Platelets: 289 10*3/uL (ref 150–450)
RBC: 5.09 x10E6/uL (ref 4.14–5.80)
RDW: 12.7 % (ref 11.6–15.4)
WBC: 7.7 10*3/uL (ref 3.4–10.8)

## 2022-07-29 LAB — CMP14+EGFR
ALT: 66 IU/L — ABNORMAL HIGH (ref 0–44)
AST: 39 IU/L (ref 0–40)
Albumin/Globulin Ratio: 2 (ref 1.2–2.2)
Albumin: 4.6 g/dL (ref 4.1–5.1)
Alkaline Phosphatase: 66 IU/L (ref 44–121)
BUN/Creatinine Ratio: 11 (ref 9–20)
BUN: 12 mg/dL (ref 6–20)
Bilirubin Total: <0.2 mg/dL (ref 0.0–1.2)
CO2: 26 mmol/L (ref 20–29)
Calcium: 10.4 mg/dL — ABNORMAL HIGH (ref 8.7–10.2)
Chloride: 104 mmol/L (ref 96–106)
Creatinine, Ser: 1.08 mg/dL (ref 0.76–1.27)
Globulin, Total: 2.3 g/dL (ref 1.5–4.5)
Glucose: 120 mg/dL — ABNORMAL HIGH (ref 70–99)
Potassium: 3.7 mmol/L (ref 3.5–5.2)
Sodium: 143 mmol/L (ref 134–144)
Total Protein: 6.9 g/dL (ref 6.0–8.5)
eGFR: 94 mL/min/{1.73_m2} (ref >59)

## 2022-07-29 LAB — TSH: TSH: 3.32 u[IU]/mL (ref 0.450–4.500)

## 2022-07-31 LAB — CT NG M GENITALIUM NAA, URINE
Chlamydia trachomatis, NAA: NEGATIVE
Mycoplasma genitalium NAA: NEGATIVE
Neisseria gonorrhoeae, NAA: NEGATIVE

## 2023-02-16 ENCOUNTER — Other Ambulatory Visit: Payer: Self-pay | Admitting: Family Medicine

## 2023-02-16 DIAGNOSIS — I10 Essential (primary) hypertension: Secondary | ICD-10-CM

## 2023-03-17 ENCOUNTER — Encounter: Payer: Self-pay | Admitting: Family Medicine

## 2023-03-17 ENCOUNTER — Other Ambulatory Visit: Payer: Self-pay | Admitting: Family Medicine

## 2023-03-17 DIAGNOSIS — I10 Essential (primary) hypertension: Secondary | ICD-10-CM

## 2023-03-17 NOTE — Telephone Encounter (Signed)
Letter sent.

## 2023-03-17 NOTE — Telephone Encounter (Signed)
Kenneth Dominguez pt NTBS 30-d given 02/16/23

## 2023-10-06 DIAGNOSIS — F332 Major depressive disorder, recurrent severe without psychotic features: Secondary | ICD-10-CM | POA: Diagnosis not present

## 2023-10-19 DIAGNOSIS — F332 Major depressive disorder, recurrent severe without psychotic features: Secondary | ICD-10-CM | POA: Diagnosis not present

## 2023-11-02 DIAGNOSIS — F332 Major depressive disorder, recurrent severe without psychotic features: Secondary | ICD-10-CM | POA: Diagnosis not present

## 2023-11-08 DIAGNOSIS — F331 Major depressive disorder, recurrent, moderate: Secondary | ICD-10-CM | POA: Diagnosis not present

## 2023-11-08 DIAGNOSIS — F4312 Post-traumatic stress disorder, chronic: Secondary | ICD-10-CM | POA: Diagnosis not present

## 2023-11-08 DIAGNOSIS — F411 Generalized anxiety disorder: Secondary | ICD-10-CM | POA: Diagnosis not present

## 2023-11-10 DIAGNOSIS — R1084 Generalized abdominal pain: Secondary | ICD-10-CM | POA: Diagnosis not present

## 2023-11-21 DIAGNOSIS — F332 Major depressive disorder, recurrent severe without psychotic features: Secondary | ICD-10-CM | POA: Diagnosis not present

## 2023-11-21 DIAGNOSIS — F900 Attention-deficit hyperactivity disorder, predominantly inattentive type: Secondary | ICD-10-CM | POA: Diagnosis not present

## 2023-12-12 DIAGNOSIS — Z7689 Persons encountering health services in other specified circumstances: Secondary | ICD-10-CM | POA: Diagnosis not present

## 2023-12-12 DIAGNOSIS — Z113 Encounter for screening for infections with a predominantly sexual mode of transmission: Secondary | ICD-10-CM | POA: Diagnosis not present

## 2023-12-12 DIAGNOSIS — Z0000 Encounter for general adult medical examination without abnormal findings: Secondary | ICD-10-CM | POA: Diagnosis not present

## 2023-12-12 DIAGNOSIS — F259 Schizoaffective disorder, unspecified: Secondary | ICD-10-CM | POA: Diagnosis not present

## 2023-12-12 DIAGNOSIS — Z1331 Encounter for screening for depression: Secondary | ICD-10-CM | POA: Diagnosis not present

## 2023-12-12 DIAGNOSIS — I10 Essential (primary) hypertension: Secondary | ICD-10-CM | POA: Diagnosis not present

## 2023-12-12 DIAGNOSIS — R109 Unspecified abdominal pain: Secondary | ICD-10-CM | POA: Diagnosis not present
